# Patient Record
Sex: Female | Born: 1960
Health system: Southern US, Community
[De-identification: ages and names within clinical notes are randomized; demographics above are authoritative.]

## PROBLEM LIST (undated history)

## (undated) DIAGNOSIS — K219 Gastro-esophageal reflux disease without esophagitis: Secondary | ICD-10-CM

## (undated) DIAGNOSIS — T4145XA Adverse effect of unspecified anesthetic, initial encounter: Secondary | ICD-10-CM

## (undated) DIAGNOSIS — Z9889 Other specified postprocedural states: Secondary | ICD-10-CM

## (undated) DIAGNOSIS — R51 Headache: Secondary | ICD-10-CM

## (undated) DIAGNOSIS — R0989 Other specified symptoms and signs involving the circulatory and respiratory systems: Secondary | ICD-10-CM

## (undated) DIAGNOSIS — T8859XA Other complications of anesthesia, initial encounter: Secondary | ICD-10-CM

## (undated) DIAGNOSIS — J329 Chronic sinusitis, unspecified: Secondary | ICD-10-CM

## (undated) DIAGNOSIS — J342 Deviated nasal septum: Secondary | ICD-10-CM

## (undated) DIAGNOSIS — R112 Nausea with vomiting, unspecified: Secondary | ICD-10-CM

## (undated) DIAGNOSIS — J343 Hypertrophy of nasal turbinates: Secondary | ICD-10-CM

## (undated) HISTORY — DX: Morbid (severe) obesity due to excess calories: E66.01

---

## 1997-04-23 HISTORY — PX: DIAGNOSTIC LAPAROSCOPY: SUR761

## 1997-07-11 ENCOUNTER — Inpatient Hospital Stay (HOSPITAL_COMMUNITY): Admission: AD | Admit: 1997-07-11 | Discharge: 1997-07-14 | Payer: Self-pay | Admitting: Obstetrics and Gynecology

## 1998-09-16 ENCOUNTER — Ambulatory Visit (HOSPITAL_COMMUNITY): Admission: RE | Admit: 1998-09-16 | Discharge: 1998-09-16 | Payer: Self-pay | Admitting: Obstetrics and Gynecology

## 2001-10-09 ENCOUNTER — Other Ambulatory Visit: Admission: RE | Admit: 2001-10-09 | Discharge: 2001-10-09 | Payer: Self-pay | Admitting: Obstetrics and Gynecology

## 2002-04-23 DIAGNOSIS — R112 Nausea with vomiting, unspecified: Secondary | ICD-10-CM

## 2002-04-23 HISTORY — DX: Nausea with vomiting, unspecified: R11.2

## 2002-06-11 ENCOUNTER — Inpatient Hospital Stay (HOSPITAL_COMMUNITY): Admission: AD | Admit: 2002-06-11 | Discharge: 2002-06-14 | Payer: Self-pay | Admitting: Obstetrics and Gynecology

## 2002-06-11 ENCOUNTER — Encounter (INDEPENDENT_AMBULATORY_CARE_PROVIDER_SITE_OTHER): Payer: Self-pay | Admitting: *Deleted

## 2002-07-09 ENCOUNTER — Other Ambulatory Visit: Admission: RE | Admit: 2002-07-09 | Discharge: 2002-07-09 | Payer: Self-pay | Admitting: Obstetrics and Gynecology

## 2003-07-13 ENCOUNTER — Other Ambulatory Visit: Admission: RE | Admit: 2003-07-13 | Discharge: 2003-07-13 | Payer: Self-pay | Admitting: Obstetrics and Gynecology

## 2004-02-22 ENCOUNTER — Emergency Department (HOSPITAL_COMMUNITY): Admission: EM | Admit: 2004-02-22 | Discharge: 2004-02-22 | Payer: Self-pay | Admitting: Emergency Medicine

## 2005-07-16 ENCOUNTER — Encounter: Admission: RE | Admit: 2005-07-16 | Discharge: 2005-07-16 | Payer: Self-pay | Admitting: Obstetrics and Gynecology

## 2006-07-23 ENCOUNTER — Encounter: Admission: RE | Admit: 2006-07-23 | Discharge: 2006-07-23 | Payer: Self-pay | Admitting: Obstetrics and Gynecology

## 2007-07-29 ENCOUNTER — Encounter: Admission: RE | Admit: 2007-07-29 | Discharge: 2007-07-29 | Payer: Self-pay | Admitting: Obstetrics and Gynecology

## 2008-07-29 ENCOUNTER — Encounter: Admission: RE | Admit: 2008-07-29 | Discharge: 2008-07-29 | Payer: Self-pay | Admitting: Obstetrics and Gynecology

## 2009-08-02 ENCOUNTER — Encounter: Admission: RE | Admit: 2009-08-02 | Discharge: 2009-08-02 | Payer: Self-pay | Admitting: Obstetrics and Gynecology

## 2010-07-10 ENCOUNTER — Other Ambulatory Visit: Payer: Self-pay | Admitting: Obstetrics and Gynecology

## 2010-07-10 DIAGNOSIS — Z1231 Encounter for screening mammogram for malignant neoplasm of breast: Secondary | ICD-10-CM

## 2010-08-04 ENCOUNTER — Ambulatory Visit
Admission: RE | Admit: 2010-08-04 | Discharge: 2010-08-04 | Disposition: A | Discharge status: Home / Self Care | Payer: 59 | Source: Ambulatory Visit | Attending: Obstetrics and Gynecology | Admitting: Obstetrics and Gynecology

## 2010-08-04 DIAGNOSIS — Z1231 Encounter for screening mammogram for malignant neoplasm of breast: Secondary | ICD-10-CM

## 2010-08-31 ENCOUNTER — Other Ambulatory Visit: Payer: Self-pay | Admitting: Obstetrics and Gynecology

## 2010-09-08 NOTE — Discharge Summary (Signed)
   NAMEHUBERT, DERSTINE                              ACCOUNT NO.:  [id number]   MEDICAL RECORD NO.:  [id number]                   PATIENT TYPE:  INP   LOCATION:  9121                                 FACILITY:  [facility]   PHYSICIAN:  Lenoard Aden, M.D.             DATE OF BIRTH:  01-Jul-1960   DATE OF ADMISSION:  06/11/2002  DATE OF DISCHARGE:  06/14/2002                                 DISCHARGE SUMMARY   HISTORY OF PRESENT ILLNESS:  The patient underwent uncomplicated repeat  cesarean section on May 11, 2002.  Postoperative course uncomplicated.  Discharged to home day three.  Discharge teaching done.  Percocet and  prenatal vitamins given upon discharge.  Follow up in the office four weeks.                                               Lenoard Aden, M.D.    RJT/MEDQ  D:  07/07/2002  T:  07/08/2002  Job:  [id number]

## 2010-09-08 NOTE — Op Note (Signed)
Brandy Payne, Brandy Payne                              ACCOUNT NO.:  [id number]   MEDICAL RECORD NO.:  [id number]                   PATIENT TYPE:  INP   LOCATION:  9121                                 FACILITY:  [facility]   PHYSICIAN:  Lenoard Aden, M.D.             DATE OF BIRTH:  08/29/1960   DATE OF PROCEDURE:  06/11/2002  DATE OF DISCHARGE:                                 OPERATIVE REPORT   PREOPERATIVE DIAGNOSIS:  Thirty-nine week intrauterine pregnancy, previous  cesarean section, for repeat.   POSTOPERATIVE DIAGNOSIS:  Thirty-nine week intrauterine pregnancy, previous  cesarean section, for repeat.   PROCEDURE:  Repeat low segment transverse cesarean section.   SURGEON:  Lenoard Aden, M.D.   ASSISTANT:  Chester Holstein. Earlene Plater, M.D.   ANESTHESIA:  Spinal by Raul Del, M.D.   ESTIMATED BLOOD LOSS:  500 mL.   DRAINS:  Foley.   COUNTS:  Correct.   DISPOSITION:  The patient to recovery in good condition.   FINDINGS:  Full-term living female, 6 pounds 10 ounces, Apgars 8 and 9.  Placenta anterior with succenturiate lobe and intact, sent to pathology for  confirmation.  The patient to recovery in good condition.   DESCRIPTION OF PROCEDURE:  After being apprised of the risks of anesthesia,  infection, infection, bleeding, injury to abdominal organs, need for repair  the patient was brought to the operating room where she was administered  spinal anesthetic without complications, prepped and draped in the usual  sterile fashion, Foley catheter placed.  After achieving adequate anesthesia  and Foley catheter placed, dilute Marcaine solution placed in the area of  the old incision.  Pfannenstiel skin incision made with the scalpel, carried  down to the fascia with electrocautery, then opened using scissors.  Rectus  muscles with diastasis in the midline.  The peritoneum was entered sharply.  The bladder blade was placed.  The lower uterine segment window was noted  very thin  lower uterine segment.  This was then incised using a scalpel.  Clear amniotic fluid noted.  Atraumatic delivery of a 6 pound 10 ounce  female, handed to the pediatricians in attendance, cord clamped and cut.  Bulb suctioning performed, Apgars 8 and 9.  Placenta delivered manually,  intact.  Accessory lobe delivered spontaneously after placental mass was  delivered.  The uterus was curetted using a dry lap pack and then closed  using 0 Monocryl in two continuous layers.  Normal tubes and  ovaries noted.  Bladder flap inspected, found to be hemostatic.  Irrigation  accomplished.  After achieving adequate hemostatis the fascia was then  closed using 0 Monocryl in continuous running fashion, the skin closed using  staples.  The patient tolerated the procedure well and went to recovery in  good condition.  Lenoard Aden, M.D.    RJT/MEDQ  D:  06/11/2002  T:  06/11/2002  Job:  [id number]

## 2010-09-08 NOTE — H&P (Signed)
   Brandy Payne, Brandy Payne                              ACCOUNT NO.:  [id number]   MEDICAL RECORD NO.:  [id number]                   PATIENT TYPE:  INP   LOCATION:  NA                                   FACILITY:  [facility]   PHYSICIAN:  Lenoard Aden, M.D.             DATE OF BIRTH:  July 03, 1960   DATE OF ADMISSION:  06/11/2002  DATE OF DISCHARGE:                                HISTORY & PHYSICAL   CHIEF COMPLAINT:  Elective repeat C-section.   HISTORY OF PRESENT ILLNESS:  A 50 year old white female, G2, P1, EDD of  06/23/02 at 39 weeks who presents for elective repeat C-section.   OBSTETRICAL HISTORY:  Remarkable for uncomplicated primary C-section in  1999, breech presentation.   ALLERGIES:  Keflex, Sulfa, and doxycycline.   MEDICATIONS:  Prenatal vitamins.   PAST MEDICAL HISTORY:  Has a remote history of Chlamydia.  No other medical  or surgical hospitalizations.   FAMILY HISTORY:  Family history of myocardial infarction, hypertension, and  abdominal cancer.   PRENATAL LAB DATA:  Blood type: O positive, rubella immune, hepatitis B  surface antigen, HIV nonreactive.   PHYSICAL EXAMINATION:  GENERAL:  On physical exam she is a well-developed,  well-nourished, white female in no apparent distress.  HEENT:  Normal.  LUNGS:  Clear.  HEART:  Regular rate and rhythm.  ABDOMEN:  Soft, gravid, nontender.  Estimated fetal weight 8 pounds.  PELVIC:  Cervix is closed, 3 cm long.  Vertex out of the pelvis.  EXTREMITIES:  There are no cords.  NEUROLOGIC:  Nonfocal.   IMPRESSION:  A 39-week intrauterine pregnancy with desire for elective  repeat C-section.   PLAN:  Proceed with an elective repeat C-section. The risks of anesthesia,  infection, bleeding, injury to the abdominal organs or need for repair is  discussed.  The patient acknowledges and wishes to proceed.                                               Lenoard Aden, M.D.    RJT/MEDQ  D:  06/10/2002  T:  06/10/2002  Job:   [id number]

## 2011-07-31 ENCOUNTER — Other Ambulatory Visit: Payer: Self-pay | Admitting: Obstetrics and Gynecology

## 2011-07-31 DIAGNOSIS — Z1231 Encounter for screening mammogram for malignant neoplasm of breast: Secondary | ICD-10-CM

## 2011-08-07 ENCOUNTER — Ambulatory Visit
Admission: RE | Admit: 2011-08-07 | Discharge: 2011-08-07 | Disposition: A | Discharge status: Home / Self Care | Payer: 59 | Source: Ambulatory Visit | Attending: Obstetrics and Gynecology | Admitting: Obstetrics and Gynecology

## 2011-08-07 DIAGNOSIS — Z1231 Encounter for screening mammogram for malignant neoplasm of breast: Secondary | ICD-10-CM

## 2011-08-09 ENCOUNTER — Other Ambulatory Visit: Payer: Self-pay | Admitting: Obstetrics and Gynecology

## 2011-08-09 DIAGNOSIS — R928 Other abnormal and inconclusive findings on diagnostic imaging of breast: Secondary | ICD-10-CM

## 2011-08-13 ENCOUNTER — Ambulatory Visit
Admission: RE | Admit: 2011-08-13 | Discharge: 2011-08-13 | Disposition: A | Discharge status: Home / Self Care | Payer: 59 | Source: Ambulatory Visit | Attending: Obstetrics and Gynecology | Admitting: Obstetrics and Gynecology

## 2011-08-13 DIAGNOSIS — R928 Other abnormal and inconclusive findings on diagnostic imaging of breast: Secondary | ICD-10-CM

## 2011-12-05 ENCOUNTER — Other Ambulatory Visit (HOSPITAL_COMMUNITY): Payer: Self-pay | Admitting: Family Medicine

## 2011-12-05 DIAGNOSIS — J329 Chronic sinusitis, unspecified: Secondary | ICD-10-CM

## 2011-12-06 ENCOUNTER — Ambulatory Visit (HOSPITAL_COMMUNITY)
Admission: RE | Admit: 2011-12-06 | Discharge: 2011-12-06 | Disposition: A | Discharge status: Home / Self Care | Payer: 59 | Source: Ambulatory Visit | Attending: Family Medicine | Admitting: Family Medicine

## 2011-12-06 DIAGNOSIS — J329 Chronic sinusitis, unspecified: Secondary | ICD-10-CM | POA: Insufficient documentation

## 2011-12-23 DIAGNOSIS — J329 Chronic sinusitis, unspecified: Secondary | ICD-10-CM

## 2011-12-23 DIAGNOSIS — J343 Hypertrophy of nasal turbinates: Secondary | ICD-10-CM

## 2011-12-23 DIAGNOSIS — J342 Deviated nasal septum: Secondary | ICD-10-CM

## 2011-12-23 HISTORY — DX: Deviated nasal septum: J34.2

## 2011-12-23 HISTORY — DX: Chronic sinusitis, unspecified: J32.9

## 2011-12-23 HISTORY — DX: Hypertrophy of nasal turbinates: J34.3

## 2011-12-27 ENCOUNTER — Encounter (HOSPITAL_BASED_OUTPATIENT_CLINIC_OR_DEPARTMENT_OTHER): Payer: Self-pay | Admitting: *Deleted

## 2011-12-27 DIAGNOSIS — R0989 Other specified symptoms and signs involving the circulatory and respiratory systems: Secondary | ICD-10-CM

## 2011-12-27 HISTORY — DX: Other specified symptoms and signs involving the circulatory and respiratory systems: R09.89

## 2011-12-31 NOTE — H&P (Signed)
PREOPERATIVE H&P  Chief Complaint: sinus problems  HPI: Brandy Payne is a 51 y.o. female who presents for evaluation of a long history of sinus problems especially on the L side. She has history of allergies. She always has trouble breathing through the L side of her nose. She had a CT scan which showed L septal deviation and moderate sinus disease in both maxillary sinuses and anterior ethmoid sinuses. The frontal and sphenoid sinuses were clear. She's taken to the OR for septoplasty, TR and limited FESS.  Past Medical History  Diagnosis Date  . Complication of anesthesia     hard to wake up post-op  . PONV (postoperative nausea and vomiting)   . Headache     with menses  . Nasal septal deviation 12/2011  . Nasal turbinate hypertrophy 12/2011  . Chronic sinusitis 12/2011  . Runny nose 12/27/2011    clear drainage, associated with sinusitis  . GERD (gastroesophageal reflux disease)     TUMS or Pepcid as needed   Past Surgical History  Procedure Date  . Cesarean section 1999, 2004  . Diagnostic laparoscopy 1999    exc. cyst Fallopian tube   History   Social History  . Marital Status: Married    Spouse Name: N/A    Number of Children: N/A  . Years of Education: N/A   Social History Main Topics  . Smoking status: Never Smoker   . Smokeless tobacco: Never Used  . Alcohol Use: Yes     occasionally  . Drug Use: No  . Sexually Active:    Other Topics Concern  . None   Social History Narrative  . None   Family History  Problem Relation Age of Onset  . Anesthesia problems Mother     post-op N/V   Allergies  Allergen Reactions  . Cephalosporins Rash    HANDS  . Doxycycline Other (See Comments)    BURNING/PRICKLY SENSATION SOLES OF FEET   Prior to Admission medications   Medication Sig Start Date End Date Taking? Authorizing Provider  acetaminophen (TYLENOL) 325 MG tablet Take 650 mg by mouth every 6 (six) hours as needed.   Yes Historical Provider, MD  fish oil-omega-3  fatty acids 1000 MG capsule Take 2 g by mouth daily.   Yes Historical Provider, MD  Multiple Vitamin (MULTIVITAMIN) tablet Take 1 tablet by mouth daily.   Yes Historical Provider, MD  predniSONE (DELTASONE) 10 MG tablet Take 10 mg by mouth daily.   Yes Historical Provider, MD     Positive ROS: nasal obstruction and sinus infections.  All other systems have been reviewed and were otherwise negative with the exception of those mentioned in the HPI and as above.  Physical Exam: There were no vitals filed for this visit.  General: Alert, no acute distress Oral: Normal oral mucosa and tonsils Nasal: Septum deviated to L, large turbinates.. R nasal polyp Neck: No palpable adenopathy or thyroid nodules Ear: Ear canal is clear with normal appearing TMs Cardiovascular: Regular rate and rhythm, no murmur.  Respiratory: Clear to auscultation Neurologic: Alert and oriented x 3   Assessment/Plan: septal deviation  turbinate hypertrophy  chronic sinusitis  Plan for Procedure(s): NASAL SEPTOPLASTY WITH TURBINATE REDUCTION ENDOSCOPIC SINUS SURGERY   Dillard Cannon, MD 12/31/2011 3:05 PM

## 2012-01-01 ENCOUNTER — Encounter (HOSPITAL_BASED_OUTPATIENT_CLINIC_OR_DEPARTMENT_OTHER)
Admission: RE | Disposition: A | Discharge status: Home / Self Care | Payer: Self-pay | Source: Ambulatory Visit | Attending: Otolaryngology

## 2012-01-01 ENCOUNTER — Encounter (HOSPITAL_BASED_OUTPATIENT_CLINIC_OR_DEPARTMENT_OTHER): Payer: Self-pay

## 2012-01-01 ENCOUNTER — Ambulatory Visit (HOSPITAL_BASED_OUTPATIENT_CLINIC_OR_DEPARTMENT_OTHER)
Admission: RE | Admit: 2012-01-01 | Discharge: 2012-01-01 | Disposition: A | Discharge status: Home / Self Care | Payer: 59 | Source: Ambulatory Visit | Attending: Otolaryngology | Admitting: Otolaryngology

## 2012-01-01 ENCOUNTER — Encounter (HOSPITAL_BASED_OUTPATIENT_CLINIC_OR_DEPARTMENT_OTHER): Payer: Self-pay | Admitting: Anesthesiology

## 2012-01-01 ENCOUNTER — Ambulatory Visit (HOSPITAL_BASED_OUTPATIENT_CLINIC_OR_DEPARTMENT_OTHER): Payer: 59 | Admitting: Anesthesiology

## 2012-01-01 DIAGNOSIS — J342 Deviated nasal septum: Secondary | ICD-10-CM | POA: Insufficient documentation

## 2012-01-01 DIAGNOSIS — J320 Chronic maxillary sinusitis: Secondary | ICD-10-CM | POA: Insufficient documentation

## 2012-01-01 DIAGNOSIS — J343 Hypertrophy of nasal turbinates: Secondary | ICD-10-CM | POA: Insufficient documentation

## 2012-01-01 DIAGNOSIS — J322 Chronic ethmoidal sinusitis: Secondary | ICD-10-CM | POA: Insufficient documentation

## 2012-01-01 HISTORY — DX: Other specified symptoms and signs involving the circulatory and respiratory systems: R09.89

## 2012-01-01 HISTORY — DX: Other specified postprocedural states: Z98.890

## 2012-01-01 HISTORY — DX: Other complications of anesthesia, initial encounter: T88.59XA

## 2012-01-01 HISTORY — DX: Hypertrophy of nasal turbinates: J34.3

## 2012-01-01 HISTORY — PX: NASAL SEPTOPLASTY W/ TURBINOPLASTY: SHX2070

## 2012-01-01 HISTORY — DX: Chronic sinusitis, unspecified: J32.9

## 2012-01-01 HISTORY — DX: Gastro-esophageal reflux disease without esophagitis: K21.9

## 2012-01-01 HISTORY — DX: Nausea with vomiting, unspecified: R11.2

## 2012-01-01 HISTORY — DX: Headache: R51

## 2012-01-01 HISTORY — PX: NASAL SINUS SURGERY: SHX719

## 2012-01-01 HISTORY — DX: Adverse effect of unspecified anesthetic, initial encounter: T41.45XA

## 2012-01-01 HISTORY — DX: Deviated nasal septum: J34.2

## 2012-01-01 LAB — POCT HEMOGLOBIN-HEMACUE: Hemoglobin: 14.4 g/dL (ref 12.0–15.0)

## 2012-01-01 SURGERY — SEPTOPLASTY, NOSE, WITH NASAL TURBINATE REDUCTION
Anesthesia: General | Site: Nose | Wound class: Clean Contaminated

## 2012-01-01 MED ORDER — PROPOFOL 10 MG/ML IV BOLUS
INTRAVENOUS | Status: DC | PRN
  Administered 2012-01-01: 150 mg via INTRAVENOUS

## 2012-01-01 MED ORDER — FENTANYL CITRATE 0.05 MG/ML IJ SOLN
INTRAMUSCULAR | Status: DC | PRN
  Administered 2012-01-01 (×4): 25 ug via INTRAVENOUS
  Administered 2012-01-01 (×2): 50 ug via INTRAVENOUS

## 2012-01-01 MED ORDER — ONDANSETRON HCL 4 MG/2ML IJ SOLN
INTRAMUSCULAR | Status: DC | PRN
  Administered 2012-01-01: 4 mg via INTRAVENOUS

## 2012-01-01 MED ORDER — HYDROMORPHONE HCL PF 1 MG/ML IJ SOLN: 0.2500 mg | INTRAMUSCULAR | Status: DC | PRN

## 2012-01-01 MED ORDER — MEPERIDINE HCL 25 MG/ML IJ SOLN: 6.2500 mg | INTRAMUSCULAR | Status: DC | PRN

## 2012-01-01 MED ORDER — MIDAZOLAM HCL 5 MG/5ML IJ SOLN
INTRAMUSCULAR | Status: DC | PRN
  Administered 2012-01-01: 2 mg via INTRAVENOUS

## 2012-01-01 MED ORDER — OXYCODONE HCL 5 MG PO TABS
5.0000 mg | ORAL_TABLET | Freq: Once | ORAL | Status: DC | PRN
Start: 2012-01-01 — End: 2012-01-01

## 2012-01-01 MED ORDER — SUCCINYLCHOLINE CHLORIDE 20 MG/ML IJ SOLN
INTRAMUSCULAR | Status: DC | PRN
  Administered 2012-01-01: 100 mg via INTRAVENOUS

## 2012-01-01 MED ORDER — OXYCODONE HCL 5 MG/5ML PO SOLN: 5.0000 mg | Freq: Once | ORAL | Status: DC | PRN

## 2012-01-01 MED ORDER — DEXAMETHASONE SODIUM PHOSPHATE 4 MG/ML IJ SOLN
INTRAMUSCULAR | Status: DC | PRN
  Administered 2012-01-01: 10 mg via INTRAVENOUS

## 2012-01-01 MED ORDER — LIDOCAINE-EPINEPHRINE 1 %-1:100000 IJ SOLN
INTRAMUSCULAR | Status: DC | PRN
  Administered 2012-01-01: 14 mL

## 2012-01-01 MED ORDER — LIDOCAINE HCL (CARDIAC) 20 MG/ML IV SOLN
INTRAVENOUS | Status: DC | PRN
  Administered 2012-01-01 (×2): 50 mg via INTRAVENOUS

## 2012-01-01 MED ORDER — BACITRACIN ZINC 500 UNIT/GM EX OINT
TOPICAL_OINTMENT | CUTANEOUS | Status: DC | PRN
  Administered 2012-01-01: 1 via TOPICAL

## 2012-01-01 MED ORDER — HYDROCODONE-ACETAMINOPHEN 5-500 MG PO TABS: 1.0000 | ORAL_TABLET | Freq: Four times a day (QID) | ORAL | Status: AC | PRN

## 2012-01-01 MED ORDER — OXYMETAZOLINE HCL 0.05 % NA SOLN
NASAL | Status: DC | PRN
  Administered 2012-01-01: 1 via NASAL

## 2012-01-01 MED ORDER — AZITHROMYCIN 500 MG PO TABS: 500.0000 mg | ORAL_TABLET | Freq: Every day | ORAL | Status: AC

## 2012-01-01 MED ORDER — ONDANSETRON HCL 4 MG/2ML IJ SOLN: 4.0000 mg | Freq: Once | INTRAMUSCULAR | Status: DC | PRN

## 2012-01-01 MED ORDER — LACTATED RINGERS IV SOLN
INTRAVENOUS | Status: DC
  Administered 2012-01-01 (×2): via INTRAVENOUS

## 2012-01-01 MED ORDER — VANCOMYCIN HCL 1000 MG IV SOLR
1000.0000 mg | INTRAVENOUS | Status: DC | PRN
  Administered 2012-01-01: 1000 mg via INTRAVENOUS

## 2012-01-01 MED ORDER — METOCLOPRAMIDE HCL 5 MG/ML IJ SOLN
INTRAMUSCULAR | Status: DC | PRN
  Administered 2012-01-01: 10 mg via INTRAVENOUS

## 2012-01-01 SURGICAL SUPPLY — 53 items
ATTRACTOMAT 16X20 MAGNETIC DRP (DRAPES) IMPLANT
BLADE INF TURB ROT M4 2 5PK (BLADE) ×2 IMPLANT
BLADE RAD40 ROTATE 4M 4 5PK (BLADE) IMPLANT
BLADE RAD60 ROTATE M4 4 5PK (BLADE) IMPLANT
BLADE ROTATE RAD12 5PK M4 4MM (BLADE) IMPLANT
BLADE TRICUT ROTATE M4 4 5PK (BLADE) ×2 IMPLANT
BUR HS RAD FRONTAL 3 (BURR) IMPLANT
CANISTER SUC SOCK COL 7 IN (MISCELLANEOUS) ×2 IMPLANT
CANISTER SUCTION 1200CC (MISCELLANEOUS) ×2 IMPLANT
CATH SINUS GUIDE F-70 (CATHETERS) IMPLANT
CLOTH BEACON ORANGE TIMEOUT ST (SAFETY) ×2 IMPLANT
COAGULATOR SUCT 8FR VV (MISCELLANEOUS) IMPLANT
DECANTER SPIKE VIAL GLASS SM (MISCELLANEOUS) IMPLANT
DRESSING NASAL KENNEDY 3.5X.9 (MISCELLANEOUS) IMPLANT
DRSG NASAL KENNEDY 3.5X.9 (MISCELLANEOUS)
DRSG NASAL KENNEDY LMNT 8CM (GAUZE/BANDAGES/DRESSINGS) IMPLANT
DRSG NASOPORE 8CM (GAUZE/BANDAGES/DRESSINGS) ×2 IMPLANT
DRSG TELFA 3X8 NADH (GAUZE/BANDAGES/DRESSINGS) ×2 IMPLANT
ELECT REM PT RETURN 9FT ADLT (ELECTROSURGICAL)
ELECTRODE REM PT RTRN 9FT ADLT (ELECTROSURGICAL) IMPLANT
GLOVE SKINSENSE NS SZ7.0 (GLOVE) ×1
GLOVE SKINSENSE STRL SZ7.0 (GLOVE) ×1 IMPLANT
GLOVE SS BIOGEL STRL SZ 7.5 (GLOVE) ×1 IMPLANT
GLOVE SUPERSENSE BIOGEL SZ 7.5 (GLOVE) ×1
GOWN PREVENTION PLUS XLARGE (GOWN DISPOSABLE) ×2 IMPLANT
GOWN STRL REIN XL XLG (GOWN DISPOSABLE) ×2 IMPLANT
HANDPIECE HYDRODEBRIDER FRONT (BLADE) IMPLANT
HEMOSTAT SURGICEL .5X2 ABSORB (HEMOSTASIS) IMPLANT
HEMOSTAT SURGICEL 2X14 (HEMOSTASIS) IMPLANT
IV NS 500ML (IV SOLUTION) ×1
IV NS 500ML BAXH (IV SOLUTION) ×1 IMPLANT
NEEDLE 27GAX1X1/2 (NEEDLE) ×2 IMPLANT
NEEDLE SPNL 25GX3.5 QUINCKE BL (NEEDLE) IMPLANT
NS IRRIG 1000ML POUR BTL (IV SOLUTION) ×2 IMPLANT
PACK BASIN DAY SURGERY FS (CUSTOM PROCEDURE TRAY) ×2 IMPLANT
PACK ENT DAY SURGERY (CUSTOM PROCEDURE TRAY) ×2 IMPLANT
PATTIES SURGICAL .5 X3 (DISPOSABLE) ×2 IMPLANT
SHEET SILASTIC 8X6X.030 25-30 (MISCELLANEOUS) IMPLANT
SLEEVE SCD COMPRESS KNEE MED (MISCELLANEOUS) ×2 IMPLANT
SOLUTION ANTI FOG 6CC (MISCELLANEOUS) ×2 IMPLANT
SPONGE GAUZE 2X2 8PLY STRL LF (GAUZE/BANDAGES/DRESSINGS) ×2 IMPLANT
SUT CHROMIC 4 0 PS 2 18 (SUTURE) ×2 IMPLANT
SUT ETHILON 3 0 PS 1 (SUTURE) IMPLANT
SUT SILK 2 0 FS (SUTURE) ×2 IMPLANT
SUT VIC AB 4-0 P-3 18XBRD (SUTURE) IMPLANT
SUT VIC AB 4-0 P3 18 (SUTURE)
SYR 3ML 18GX1 1/2 (SYRINGE) IMPLANT
SYSTEM HYDRODEBRIDER (MISCELLANEOUS) IMPLANT
TOWEL OR 17X24 6PK STRL BLUE (TOWEL DISPOSABLE) ×2 IMPLANT
TRAY DSU PREP LF (CUSTOM PROCEDURE TRAY) ×2 IMPLANT
TUBE CONNECTING 20X1/4 (TUBING) ×2 IMPLANT
WATER STERILE IRR 1000ML POUR (IV SOLUTION) IMPLANT
YANKAUER SUCT BULB TIP NO VENT (SUCTIONS) ×2 IMPLANT

## 2012-01-01 NOTE — Anesthesia Postprocedure Evaluation (Signed)
Anesthesia Post Note  Patient: Brandy Payne  Procedure(s) Performed: Procedure(s) (LRB): NASAL SEPTOPLASTY WITH TURBINATE REDUCTION (N/A) ENDOSCOPIC SINUS SURGERY (N/A)  Anesthesia type: general  Patient location: PACU  Post pain: Pain level controlled  Post assessment: Patient's Cardiovascular Status Stable  Last Vitals:  Filed Vitals:   01/01/12 0633  BP: 136/85  Pulse: 86  Temp: 36.8 C  Resp: 20    Post vital signs: Reviewed and stable  Level of consciousness: sedated  Complications: No apparent anesthesia complications

## 2012-01-01 NOTE — Brief Op Note (Signed)
01/01/2012  9:45 AM  PATIENT:  Brandy Payne  51 y.o. female  PRE-OPERATIVE DIAGNOSIS:  septal deviation  turbinate hypertrophy  chronic sinusitis   POST-OPERATIVE DIAGNOSIS:  septal deviation  turbinate hypertrophy  chronic sinusitis  PROCEDURE:  Procedure(s) (LRB) with comments: NASAL SEPTOPLASTY WITH TURBINATE REDUCTION (N/A) - nasal  septoplasty with turbinate reductions and fess ENDOSCOPIC SINUS SURGERY (N/A)  SURGEON:  Surgeon(s) and Role:    * Drema Halon, MD - Primary  PHYSICIAN ASSISTANT:   ASSISTANTS: none   ANESTHESIA:   general  EBL:  Total I/O In: 1000 [I.V.:1000] Out: -   BLOOD ADMINISTERED:none  DRAINS: none   LOCAL MEDICATIONS USED:  Amount: 15 ml  SPECIMEN:  Source of Specimen:  sinus polyps and turbinate tissue  DISPOSITION OF SPECIMEN:  PATHOLOGY  COUNTS:  YES  TOURNIQUET:  * No tourniquets in log *  DICTATION: .Other Dictation: Dictation Number [id number]  PLAN OF CARE: Discharge to home after PACU  PATIENT DISPOSITION:  PACU - hemodynamically stable.   Delay start of Pharmacological VTE agent (>24hrs) due to surgical blood loss or risk of bleeding: yes

## 2012-01-01 NOTE — Interval H&P Note (Signed)
History and Physical Interval Note:  01/01/2012 7:42 AM  Brandy Payne  has presented today for surgery, with the diagnosis of septal deviation  turbinate hypertrophy  chronic sinusitis   The various methods of treatment have been discussed with the patient and family. After consideration of risks, benefits and other options for treatment, the patient has consented to  Procedure(s) (LRB) with comments: NASAL SEPTOPLASTY WITH TURBINATE REDUCTION (N/A) - nasal  septoplasty with turbinate reductions and fess ENDOSCOPIC SINUS SURGERY (N/A) as a surgical intervention .  The patient's history has been reviewed, patient examined, no change in status, stable for surgery.  I have reviewed the patient's chart and labs.  Questions were answered to the patient's satisfaction.     Cuma Polyakov

## 2012-01-01 NOTE — Anesthesia Preprocedure Evaluation (Signed)
Anesthesia Evaluation  Patient identified by MRN, date of birth, ID band Patient awake    Reviewed: Allergy & Precautions, H&P , NPO status , Patient's Chart, lab work & pertinent test results  History of Anesthesia Complications (+) PONV  Airway Mallampati: I TM Distance: >3 FB Neck ROM: Full    Dental   Pulmonary          Cardiovascular     Neuro/Psych    GI/Hepatic GERD-  Medicated and Controlled,  Endo/Other    Renal/GU      Musculoskeletal   Abdominal   Peds  Hematology   Anesthesia Other Findings   Reproductive/Obstetrics                           Anesthesia Physical Anesthesia Plan  ASA: II  Anesthesia Plan: General   Post-op Pain Management:    Induction: Intravenous  Airway Management Planned: Oral ETT  Additional Equipment:   Intra-op Plan:   Post-operative Plan: Extubation in OR  Informed Consent: I have reviewed the patients History and Physical, chart, labs and discussed the procedure including the risks, benefits and alternatives for the proposed anesthesia with the patient or authorized representative who has indicated his/her understanding and acceptance.     Plan Discussed with: CRNA and Surgeon  Anesthesia Plan Comments:         Anesthesia Quick Evaluation

## 2012-01-01 NOTE — Transfer of Care (Signed)
Immediate Anesthesia Transfer of Care Note  Patient: Brandy Payne  Procedure(s) Performed: Procedure(s) (LRB) with comments: NASAL SEPTOPLASTY WITH TURBINATE REDUCTION (N/A) - nasal  septoplasty with turbinate reductions and fess ENDOSCOPIC SINUS SURGERY (N/A)  Patient Location: PACU  Anesthesia Type: General  Level of Consciousness: awake, alert  and oriented  Airway & Oxygen Therapy: Patient Spontanous Breathing and Patient connected to face mask oxygen  Post-op Assessment: Report given to PACU RN and Post -op Vital signs reviewed and stable  Post vital signs: Reviewed and stable  Complications: No apparent anesthesia complications

## 2012-01-01 NOTE — Anesthesia Postprocedure Evaluation (Signed)
Anesthesia Post Note  Patient: Brandy Payne  Procedure(s) Performed: Procedure(s) (LRB): NASAL SEPTOPLASTY WITH TURBINATE REDUCTION (N/A) ENDOSCOPIC SINUS SURGERY (N/A)  Anesthesia type: general  Patient location: PACU  Post pain: Pain level controlled  Post assessment: Patient's Cardiovascular Status Stable  Last Vitals:  Filed Vitals:   01/01/12 1030  BP: 124/85  Pulse: 104  Temp:   Resp: 14    Post vital signs: Reviewed and stable  Level of consciousness: sedated  Complications: No apparent anesthesia complications

## 2012-01-01 NOTE — Anesthesia Procedure Notes (Signed)
Procedure Name: Intubation Date/Time: 01/01/2012 7:56 AM Performed by: Zenia Resides D Pre-anesthesia Checklist: Patient identified, Emergency Drugs available, Suction available, Patient being monitored and Timeout performed Patient Re-evaluated:Patient Re-evaluated prior to inductionOxygen Delivery Method: Circle System Utilized Preoxygenation: Pre-oxygenation with 100% oxygen Intubation Type: IV induction Ventilation: Mask ventilation without difficulty Laryngoscope Size: Mac and 3 Grade View: Grade II Tube type: Oral Number of attempts: 1 Airway Equipment and Method: stylet and oral airway Placement Confirmation: ETT inserted through vocal cords under direct vision,  positive ETCO2 and breath sounds checked- equal and bilateral Secured at: 22 cm Tube secured with: Tape Dental Injury: Teeth and Oropharynx as per pre-operative assessment

## 2012-01-02 ENCOUNTER — Encounter (HOSPITAL_BASED_OUTPATIENT_CLINIC_OR_DEPARTMENT_OTHER): Payer: Self-pay | Admitting: Otolaryngology

## 2012-01-02 NOTE — Op Note (Signed)
Brandy Payne, Brandy Payne                    ACCOUNT NO.:  [id number]  MEDICAL RECORD NO.:  [id number]  LOCATION:                                 FACILITY:  PHYSICIAN:  Kristine Garbe. Ezzard Standing, M.D.DATE OF BIRTH:  14-Feb-1961  DATE OF PROCEDURE:  01/01/2012 DATE OF DISCHARGE:                              OPERATIVE REPORT   PREOPERATIVE DIAGNOSES:  Septal deviation and turbinate hypertrophy with chronic sinus disease and nasal obstruction.  POSTOPERATIVE DIAGNOSES:  Septal deviation and turbinate hypertrophy with chronic sinus disease and nasal obstruction.  OPERATION PERFORMED:  Septoplasty with bilateral inferior turbinate reductions.  Functional endoscopic sinus surgery with bilateral ethmoidectomy and bilateral maxillary ostial enlargement.  SURGEON:  Kristine Garbe. Ezzard Standing, MD  ANESTHESIA:  General.  COMPLICATIONS:  None.  BRIEF CLINICAL INDICATION:  Brandy Payne is a 51 year old female who has history of allergies as well as a history of sinus problems especially on the left side.  She has been on three rounds of antibiotics this year for sinus infections.  She also has history of allergies, for which, she takes medication.  She underwent a CT scan, which showed a significant septal deviation with a large bony spur protruding to the left middle turbinate and left middle meatus area.  She had some small polyps within the middle meatus and anterior ethmoid area and some mucoperiosteal thickening within both maxillary sinuses.  The sphenoid sinuses and frontal sinuses were clear.  She was taken to the operating room at this time for septoplasty, turbinate reductions, and functional endoscopic sinus surgery.  DESCRIPTION OF PROCEDURE:  After adequate endotracheal anesthesia, the nose was prepped with Betadine solution and draped out in a sterile towels.  The nose was then further prepped with cotton pledgets, soaked in Afrin for decongesting the nose.  Septum turbinates and middle  meatus area was injected with approximately 15 mL of Xylocaine with epinephrine for hemostasis.  On examination, the patient had a fairly significant septal deviation to the left making evaluation of the left middle meatus and injection of the left middle meatus little bit difficult.  Until the septoplasty was performed, a hemitransfixion incision was made along the cartilaginous septum on the right side.  Mucoperiosteal flaps were elevated posteriorly.  Just anterior to the bony septum, a vertical incision was made through the cartilage.  The mucoperiosteal and mucoperichondrial flaps were elevated posterior to this on either side of the septum.  The patient had a large bony spur protruding to the left middle meatus area.  After elevating mucoperiosteum on either side of the spur, the large spur was removed in addition to some of the bony septum that protruded more to the left side.  This allowed better access to the middle meatus on the left side as well as improve airway on the left side.  The anterior cartilaginous septum was fairly midline.  This completed the septoplasty portion of the procedure.  Next, using the endoscopes, middle meatus was approached on the right side, and she had several polyps within the middle meatus that were removed and then the uncinate process was incised and removed.  The anterior and few of the posterior ethmoid cells  were opened up with a straight Thru-Cut forceps and the microdebrider.  The sinus ostia was identified and then it was enlarged with a backbiting and straight Thru-Cut forceps to approximately 1 cm in size.  After completing the ethmoidectomy and the ostial enlargement, right side was completed.  Next, the left side was approached.  The middle meatus with outfractured.  The uncinate process was incised and then the anterior and few of the posterior ethmoid cells were opened up with straight Thru-Cut and microdebrider.  The few little small  polyps on the left side, which were removed.  The maxillary ostia on the left side was identified, was enlarged to with backbiting and straight Thru-Cut forceps.  The maxillary sinus was fairly clear of any disease.  This completed the procedure on the sinuses bilaterally.  At the very completion of the procedure, inferior turbinate reductions were performed using the turbinate blade on the microdebrider.  Submucosal turbinate reductions were performed bilaterally and the turbinates were outfractured.  The patient had some cobblestone almost bumpy inferior turbinate tissue and a small piece of turbinate tissue from the left side was excised and sent to Pathology for evaluation.  This area was cauterized with suction cautery.  This completed the procedure.  The hemitransfixion incision was closed with interrupted 4-0 chromic suture. The nose was packed with Nasopore, placed within the middle meatus bilaterally and then Telfa soaked in bacitracin ointment was placed along the septum on either side bilaterally and secured anteriorly with 2-0 silk suture.  This completed the procedure.  Brandy Payne was awakened from anesthesia and transferred to the recovery room, postop doing well.  DISPOSITION:  She is discharged home later this morning on Z-PAK for 5 days, Tylenol, Vicodin p.r.n. pain.  We will have her follow up in my office tomorrow to have the Telfa pack removed from the nose.  We will have her follow up in 1 week for recheck and cleaning the sinuses.          ______________________________ Kristine Garbe Ezzard Standing, M.D.     CEN/MEDQ  D:  01/01/2012  T:  01/02/2012  Job:  [id number]  cc:   Lucky Cowboy, M.D.

## 2012-07-30 ENCOUNTER — Other Ambulatory Visit: Payer: Self-pay

## 2012-07-30 DIAGNOSIS — Z1231 Encounter for screening mammogram for malignant neoplasm of breast: Secondary | ICD-10-CM

## 2012-08-26 ENCOUNTER — Emergency Department (HOSPITAL_COMMUNITY): Payer: 59

## 2012-08-26 ENCOUNTER — Emergency Department (HOSPITAL_COMMUNITY)
Admission: EM | Admit: 2012-08-26 | Discharge: 2012-08-26 | Disposition: A | Discharge status: Home / Self Care | Payer: 59 | Attending: Emergency Medicine | Admitting: Emergency Medicine

## 2012-08-26 ENCOUNTER — Encounter (HOSPITAL_COMMUNITY): Payer: Self-pay | Admitting: Emergency Medicine

## 2012-08-26 DIAGNOSIS — W010XXA Fall on same level from slipping, tripping and stumbling without subsequent striking against object, initial encounter: Secondary | ICD-10-CM | POA: Insufficient documentation

## 2012-08-26 DIAGNOSIS — W19XXXA Unspecified fall, initial encounter: Secondary | ICD-10-CM

## 2012-08-26 DIAGNOSIS — X500XXA Overexertion from strenuous movement or load, initial encounter: Secondary | ICD-10-CM | POA: Insufficient documentation

## 2012-08-26 DIAGNOSIS — Y9289 Other specified places as the place of occurrence of the external cause: Secondary | ICD-10-CM | POA: Insufficient documentation

## 2012-08-26 DIAGNOSIS — Y9301 Activity, walking, marching and hiking: Secondary | ICD-10-CM | POA: Insufficient documentation

## 2012-08-26 DIAGNOSIS — S0101XA Laceration without foreign body of scalp, initial encounter: Secondary | ICD-10-CM

## 2012-08-26 DIAGNOSIS — Z87720 Personal history of (corrected) congenital malformations of eye: Secondary | ICD-10-CM | POA: Insufficient documentation

## 2012-08-26 DIAGNOSIS — S93409A Sprain of unspecified ligament of unspecified ankle, initial encounter: Secondary | ICD-10-CM | POA: Insufficient documentation

## 2012-08-26 DIAGNOSIS — Z8709 Personal history of other diseases of the respiratory system: Secondary | ICD-10-CM | POA: Insufficient documentation

## 2012-08-26 DIAGNOSIS — Z8719 Personal history of other diseases of the digestive system: Secondary | ICD-10-CM | POA: Insufficient documentation

## 2012-08-26 DIAGNOSIS — Z79899 Other long term (current) drug therapy: Secondary | ICD-10-CM | POA: Insufficient documentation

## 2012-08-26 DIAGNOSIS — Z8669 Personal history of other diseases of the nervous system and sense organs: Secondary | ICD-10-CM | POA: Insufficient documentation

## 2012-08-26 MED ORDER — TRAMADOL HCL 50 MG PO TABS: 50.0000 mg | ORAL_TABLET | Freq: Four times a day (QID) | ORAL | Status: DC | PRN

## 2012-08-26 NOTE — ED Notes (Signed)
Pt c/o tripping today and falling into rocking chair and porch; pt with laceration to back of head and pain in left ankle; pt denies LOC; bleeding controlled

## 2012-08-26 NOTE — ED Provider Notes (Signed)
History     CSN: [id number]  Arrival date & time 08/26/12  0917   First MD Initiated Contact with Patient 08/26/12 [phone]      Chief Complaint  Patient presents with  . Fall  . Head Laceration  . Foot Pain    (Consider location/radiation/quality/duration/timing/severity/associated sxs/prior treatment) HPI Comments: Patient reports that just prior to arrival she tripped on a step walking out of her door.  She then fell into a chair that was on her front porch and cut her left lateral scalp on the chair.  She also twisted her left ankle when she fell and is having pain of the left ankle.  She denies LOC.  She has been able to ambulate since the injury, but has increased pain with ambulation.  She has not taken anything for pain prior to arrival.  She is currently not on any blood thinning medications.  She denies any nausea, vomiting, or vision changes.   Denies any neck or back pain.  No numbness or tingling.  The history is provided by the patient.    Past Medical History  Diagnosis Date  . Complication of anesthesia     hard to wake up post-op  . PONV (postoperative nausea and vomiting)   . Headache     with menses  . Nasal septal deviation 12/2011  . Nasal turbinate hypertrophy 12/2011  . Chronic sinusitis 12/2011  . Runny nose 12/27/2011    clear drainage, associated with sinusitis  . GERD (gastroesophageal reflux disease)     TUMS or Pepcid as needed    Past Surgical History  Procedure Laterality Date  . Cesarean section  1999, 2004  . Diagnostic laparoscopy  1999    exc. cyst Fallopian tube  . Nasal septoplasty w/ turbinoplasty  01/01/2012    Procedure: NASAL SEPTOPLASTY WITH TURBINATE REDUCTION;  Surgeon: Drema Halon, MD;  Location: [facility];  Service: ENT;  Laterality: N/A;  nasal  septoplasty with turbinate reductions and fess  . Nasal sinus surgery  01/01/2012    Procedure: ENDOSCOPIC SINUS SURGERY;  Surgeon: Drema Halon, MD;  Location:   SURGERY CENTER;  Service: ENT;  Laterality: N/A;    Family History  Problem Relation Age of Onset  . Anesthesia problems Mother     post-op N/V    History  Substance Use Topics  . Smoking status: Never Smoker   . Smokeless tobacco: Never Used  . Alcohol Use: Yes     Comment: occasionally    OB History   Grav Para Term Preterm Abortions TAB SAB Ect Mult Living                  Review of Systems  Musculoskeletal:       Ankle pain  Skin: Positive for wound.  All other systems reviewed and are negative.    Allergies  Cephalosporins; Doxycycline; and Sulfa antibiotics  Home Medications   Current Outpatient Rx  Name  Route  Sig  Dispense  Refill  . cetirizine (ZYRTEC) 10 MG tablet   Oral   Take 10 mg by mouth daily.         . Cholecalciferol (VITAMIN D) 2000 UNITS CAPS   Oral   Take 2,000 Units by mouth daily.          . fish oil-omega-3 fatty acids 1000 MG capsule   Oral   Take 2 g by mouth daily.  BP 136/90  Pulse 97  Temp(Src) 98 F (36.7 C) (Oral)  Resp 18  Ht 4\' 11"  (1.499 m)  Wt 180 lb (81.647 kg)  BMI 36.34 kg/m2  SpO2 100%  LMP 07/23/2012  Physical Exam  Nursing note and vitals reviewed. Constitutional: She appears well-developed and well-nourished. No distress.  HENT:  Head:    Eyes: EOM are normal. Pupils are equal, round, and reactive to light.  Neck: Normal range of motion. Neck supple.  Cardiovascular: Normal rate, regular rhythm and normal heart sounds.   Pulmonary/Chest: Effort normal and breath sounds normal.  Musculoskeletal:       Left hip: She exhibits normal range of motion, normal strength and no swelling.       Left knee: She exhibits normal range of motion, no swelling and no deformity. No tenderness found.       Left ankle: She exhibits swelling. She exhibits no deformity and no laceration. Tenderness. Lateral malleolus tenderness found.       Cervical back: She exhibits normal range of motion,  no tenderness, no bony tenderness, no swelling, no edema and no deformity.       Thoracic back: She exhibits normal range of motion, no tenderness, no bony tenderness, no swelling, no edema and no deformity.       Lumbar back: She exhibits normal range of motion, no tenderness, no bony tenderness, no swelling, no edema and no deformity.  Pelvis stable  Neurological: She is alert. She has normal strength. No cranial nerve deficit or sensory deficit. Gait normal.  Skin: She is not diaphoretic.    ED Course  Procedures (including critical care time)  Labs Reviewed - No data to display Dg Ankle Complete Left  08/26/2012  *RADIOLOGY REPORT*  Clinical Data: Pain post trauma  LEFT ANKLE COMPLETE - 3+ VIEW  Comparison:  February 22, 2004  Findings:  Frontal, oblique, lateral views were obtained. Calcification is noted adjacent to the lateral malleolus, indicative of old trauma.  There is no acute appearing fracture. No effusion.  Ankle mortise appears intact.  There is a minimal spur arising from the posterior calcaneus.  IMPRESSION: Residua of old trauma lateral malleolar region.  No acute fracture or joint effusion.  Mortise intact.   Original Report Authenticated By: Bretta Bang, M.D.      No diagnosis found.  LACERATION REPAIR Performed by: Anne Shutter, Dysen Edmondson Authorized by: Anne Shutter, Herbert Seta Consent: Verbal consent obtained. Risks and benefits: risks, benefits and alternatives were discussed Consent given by: patient Patient identity confirmed: provided demographic data Prepped and Draped in normal sterile fashion Wound explored  Laceration Location: left scalp  Laceration Length: 5 cm  No Foreign Bodies seen or palpated  Anesthesia: local infiltration  Local anesthetic: lidocaine 2% with epinephrine  Anesthetic total: 4 ml  Irrigation method: syringe Amount of cleaning: standard  Skin closure: staples  Number of sutures: 6  Patient tolerance: Patient tolerated the  procedure well with no immediate complications.   MDM  Patient presenting after a mechanical fall with laceration of the scalp and left ankle pain.  No LOC.  Normal neurological exam.  She is currently not on any blood thinning medications.  Laceration repaired with staples.  Xray of the ankle negative.  Patient neurovascularly intact.  Patient given ankle ASO and crutches and discharged home.  Return precautions given.        Pascal Lux [place], PA-C 08/27/12 [phone]

## 2012-08-26 NOTE — Discharge Instructions (Signed)
Ankle Sprain  An ankle sprain happens when the bands of tissue that hold the ankle bones together (ligaments) stretch too much and tear.   HOME CARE    Put ice on the ankle for 15 to 20 minutes, 3 to 4 times a day.   Put ice in a plastic bag.   Place a towel between your skin and the bag.   You may stop icing when the puffiness (swelling) goes down.   Raise (elevate) the injured ankle to lessen puffiness.   Use crutches if your doctor tells you to. Use them until you can walk without pain.   If a plaster splint was applied:   Rest the plaster splint on nothing harder than a pillow for 24 hours.   Do not put weight on it.   Do not get it wet.   Take it off to shower or bathe.   Follow up with your doctor.   Use an elastic wrap for support. Take the wrap off if the toes lose feeling (numb), tingle, or turn cold or blue.   If an air splint was applied:   Add or release air to make it comfortable.   Take it off at night and to shower and bathe.   Wiggle your toes while wearing the air splint.   Only take medicine as told by your doctor.   Do not drive until your doctor says it is okay.  GET HELP RIGHT AWAY IF:    You have more bruising, puffiness, or pain.   Your toes feel cold.   Your medicine does not help lessen your pain.   You are losing feeling in your toes or they turn blue.   You have severe pain.  MAKE SURE YOU:    Understand these instructions.   Will watch your condition.   Will get help right away if you are not doing well or get worse.  Document Released: 09/26/2007 Document Revised: 07/02/2011 Document Reviewed: 09/26/2007  Exit[employer] Patient Information 2013 [employer].

## 2012-08-28 NOTE — ED Provider Notes (Signed)
Medical screening examination/treatment/procedure(s) were performed by non-physician practitioner and as supervising physician I was immediately available for consultation/collaboration.   Kyzen Horn, MD 08/28/12 1528 

## 2012-09-09 ENCOUNTER — Ambulatory Visit
Admission: RE | Admit: 2012-09-09 | Discharge: 2012-09-09 | Disposition: A | Discharge status: Home / Self Care | Payer: 59 | Source: Ambulatory Visit

## 2012-09-09 DIAGNOSIS — Z1231 Encounter for screening mammogram for malignant neoplasm of breast: Secondary | ICD-10-CM

## 2012-09-16 ENCOUNTER — Other Ambulatory Visit: Payer: Self-pay | Admitting: Gastroenterology

## 2012-10-28 ENCOUNTER — Encounter (HOSPITAL_COMMUNITY): Payer: Self-pay | Admitting: Pharmacy Technician

## 2012-10-29 ENCOUNTER — Encounter (HOSPITAL_COMMUNITY): Payer: Self-pay | Admitting: *Deleted

## 2012-11-18 ENCOUNTER — Ambulatory Visit (HOSPITAL_COMMUNITY): Payer: 59 | Admitting: Anesthesiology

## 2012-11-18 ENCOUNTER — Encounter (HOSPITAL_COMMUNITY): Payer: Self-pay | Admitting: *Deleted

## 2012-11-18 ENCOUNTER — Encounter (HOSPITAL_COMMUNITY): Payer: Self-pay | Admitting: Anesthesiology

## 2012-11-18 ENCOUNTER — Encounter (HOSPITAL_COMMUNITY)
Admission: RE | Disposition: A | Discharge status: Home / Self Care | Payer: Self-pay | Source: Ambulatory Visit | Attending: Gastroenterology

## 2012-11-18 ENCOUNTER — Ambulatory Visit (HOSPITAL_COMMUNITY)
Admission: RE | Admit: 2012-11-18 | Discharge: 2012-11-18 | Disposition: A | Discharge status: Home / Self Care | Payer: 59 | Source: Ambulatory Visit | Attending: Gastroenterology | Admitting: Gastroenterology

## 2012-11-18 DIAGNOSIS — Z1211 Encounter for screening for malignant neoplasm of colon: Secondary | ICD-10-CM | POA: Insufficient documentation

## 2012-11-18 HISTORY — PX: COLONOSCOPY WITH PROPOFOL: SHX5780

## 2012-11-18 SURGERY — COLONOSCOPY WITH PROPOFOL
Anesthesia: Monitor Anesthesia Care

## 2012-11-18 MED ORDER — LACTATED RINGERS IV SOLN
INTRAVENOUS | Status: DC
  Administered 2012-11-18: 100 mL via INTRAVENOUS

## 2012-11-18 MED ORDER — PROPOFOL INFUSION 10 MG/ML OPTIME
INTRAVENOUS | Status: DC | PRN
  Administered 2012-11-18: 300 ug/kg/min via INTRAVENOUS

## 2012-11-18 MED ORDER — SODIUM CHLORIDE 0.9 % IV SOLN: INTRAVENOUS | Status: DC

## 2012-11-18 SURGICAL SUPPLY — 21 items

## 2012-11-18 NOTE — Transfer of Care (Signed)
Immediate Anesthesia Transfer of Care Note  Patient: Brandy Payne  Procedure(s) Performed: Procedure(s): COLONOSCOPY WITH PROPOFOL (N/A)  Patient Location: PACU and Endoscopy Unit  Anesthesia Type:MAC  Level of Consciousness: awake, alert  and patient cooperative  Airway & Oxygen Therapy: Patient Spontanous Breathing and Patient connected to face mask oxygen  Post-op Assessment: Report given to PACU RN and Post -op Vital signs reviewed and stable  Post vital signs: Reviewed and stable  Complications: No apparent anesthesia complications

## 2012-11-18 NOTE — H&P (Signed)
  Procedure: Baseline screening colonoscopy  History: The patient is a 52 year old female born [birth year]. Patient is scheduled to undergo her first screening colonoscopy with polypectomy to prevent colon cancer.  Medication allergies: None  Past medical history: Chronic sinusitis. Uterine cyst. Cesarean sections in 1999 and 2004. Fiberoptic sinus surgery.  Habits: The patient has never smoked cigarettes and consumes alcohol in moderation  Exam: The patient is alert and lying comfortably on the endoscopy stretcher. Cardiac exam reveals a regular rhythm. Lungs are clear to auscultation. Abdomen is soft and nontender to palpation.  Plan: Proceed with baseline screening colonoscopy.

## 2012-11-18 NOTE — Preoperative (Signed)
Beta Blockers   Reason not to administer Beta Blockers:Not Applicable 

## 2012-11-18 NOTE — Anesthesia Preprocedure Evaluation (Signed)
Anesthesia Evaluation  Patient identified by MRN, date of birth, ID band Patient awake    Reviewed: Allergy & Precautions, H&P , NPO status , Patient's Chart, lab work & pertinent test results  History of Anesthesia Complications (+) PONV  Airway Mallampati: I TM Distance: >3 FB Neck ROM: Full    Dental   Pulmonary neg pulmonary ROS,          Cardiovascular negative cardio ROS      Neuro/Psych negative neurological ROS  negative psych ROS   GI/Hepatic Neg liver ROS, GERD-  Medicated and Controlled,  Endo/Other  negative endocrine ROS  Renal/GU negative Renal ROS  negative genitourinary   Musculoskeletal negative musculoskeletal ROS (+)   Abdominal   Peds negative pediatric ROS (+)  Hematology negative hematology ROS (+)   Anesthesia Other Findings   Reproductive/Obstetrics negative OB ROS                           Anesthesia Physical Anesthesia Plan  ASA: II  Anesthesia Plan: MAC   Post-op Pain Management:    Induction:   Airway Management Planned: Simple Face Mask  Additional Equipment:   Intra-op Plan:   Post-operative Plan:   Informed Consent: I have reviewed the patients History and Physical, chart, labs and discussed the procedure including the risks, benefits and alternatives for the proposed anesthesia with the patient or authorized representative who has indicated his/her understanding and acceptance.   Dental advisory given  Plan Discussed with:   Anesthesia Plan Comments:         Anesthesia Quick Evaluation

## 2012-11-18 NOTE — Anesthesia Postprocedure Evaluation (Signed)
  Anesthesia Post-op Note  Patient: Brandy Payne  Procedure(s) Performed: Procedure(s) (LRB): COLONOSCOPY WITH PROPOFOL (N/A)  Patient Location: PACU  Anesthesia Type: MAC  Level of Consciousness: awake and alert   Airway and Oxygen Therapy: Patient Spontanous Breathing  Post-op Pain: mild  Post-op Assessment: Post-op Vital signs reviewed, Patient's Cardiovascular Status Stable, Respiratory Function Stable, Patent Airway and No signs of Nausea or vomiting  Last Vitals:  Filed Vitals:   11/18/12 1307  BP: 135/96  Pulse:   Temp:   Resp: 18    Post-op Vital Signs: stable   Complications: No apparent anesthesia complications

## 2012-11-18 NOTE — Op Note (Signed)
Procedure: Baseline screening colonoscopy  Endoscopist: Danise Edge  Premedication: Propofol administered by anesthesia  Procedure: The patient was placed in the left lateral decubitus position. Anal inspection and digital rectal exam were normal. The Pentax pediatric colonoscope was introduced into the rectum and advanced to the cecum. A normal-appearing ileocecal valve and appendiceal orifice were identified. Colonic preparation for the exam today was good.  Rectum. Normal. Retroflexed view of the distal rectum normal.  Sigmoid colon and descending colon. Normal.  Splenic flexure. Normal.  Transverse colon. Normal.  Hepatic flexure. Normal.  Ascending colon. Normal.  Cecum and ileocecal valve. Normal.  Assessment: Normal screening proctocolonoscopy to the cecum  Recommendations: Schedule repeat screening colonoscopy in 10 years.

## 2012-11-19 ENCOUNTER — Encounter (HOSPITAL_COMMUNITY): Payer: Self-pay | Admitting: Gastroenterology

## 2013-09-16 IMAGING — CT CT MAXILLOFACIAL W/O CM
2 series · 16 of 37 positions shown, 20 images · non-contrast
Comparison: None.

CLINICAL DATA: Recurrent sinusitis.

CT MAXILLOFACIAL WITHOUT CONTRAST
TECHNIQUE: Multidetector CT imaging of the maxillofacial
structures was performed. Multiplanar CT image reconstructions were
also generated.

[Series 602: coronal images · axial · 0.29mm/px · z∈[-534,-451]mm · 13 of 55 slices shown, 17 images]
[im 4/55  brain]
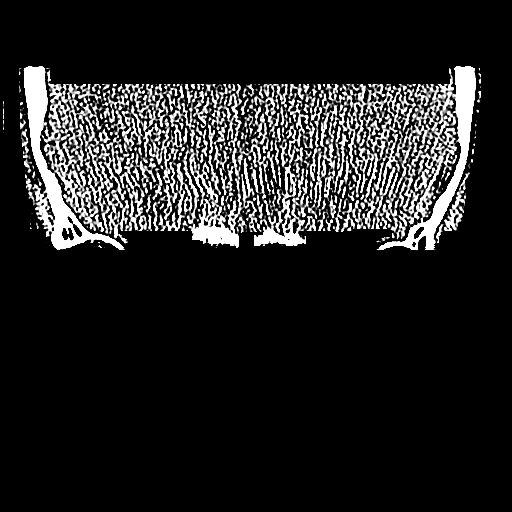
[im 4/55  bone]
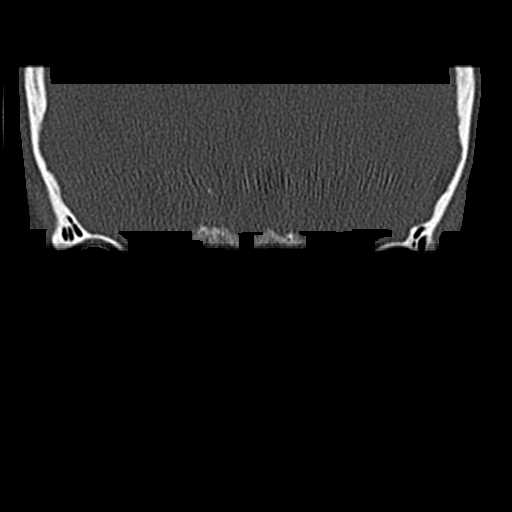
[im 8/55  bone]
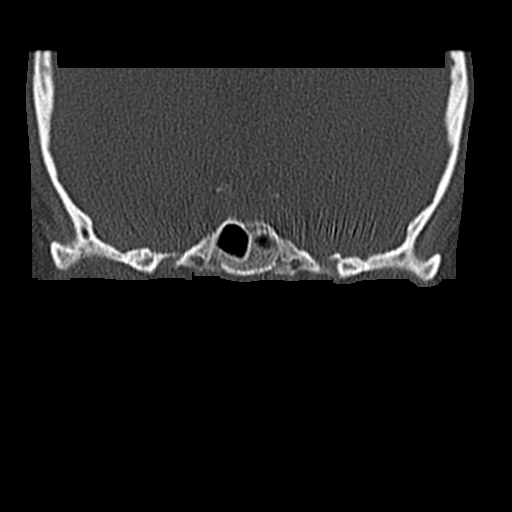
[im 12/55  bone]
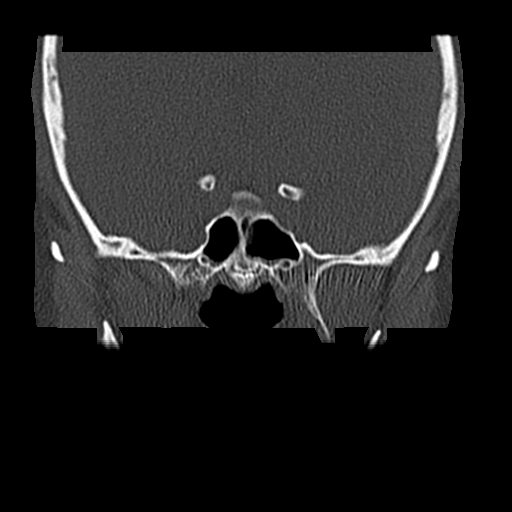
[im 15/55  bone]
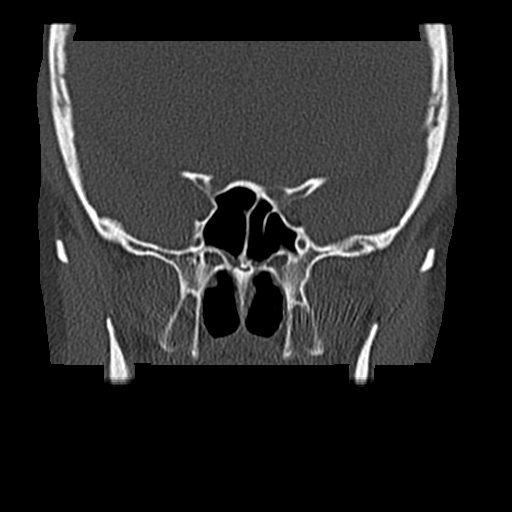
[im 19/55  brain]
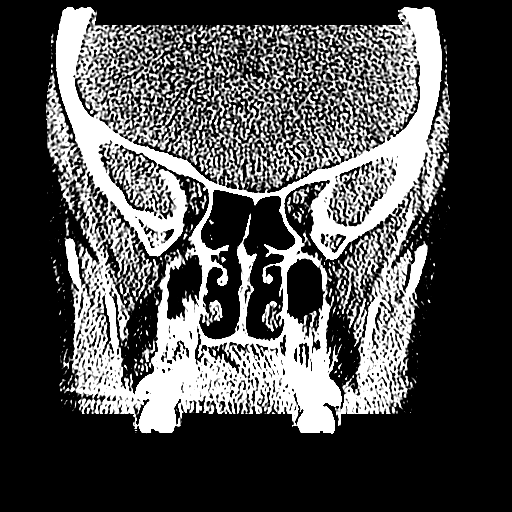
[im 19/55  bone]
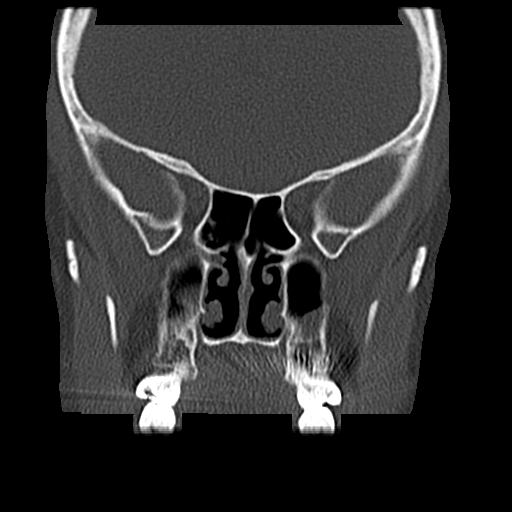
[im 23/55  bone]
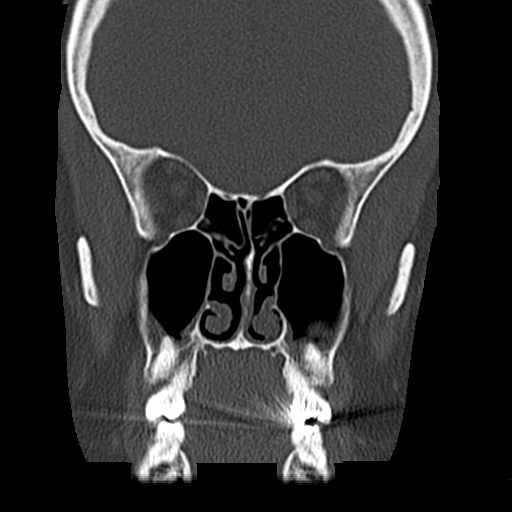
[im 28/55  bone]
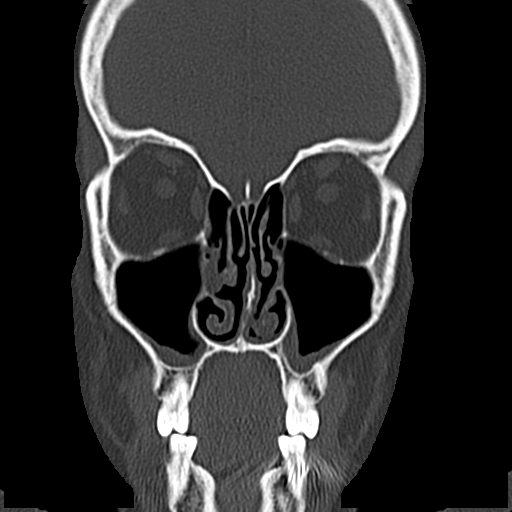
[im 32/55  bone]
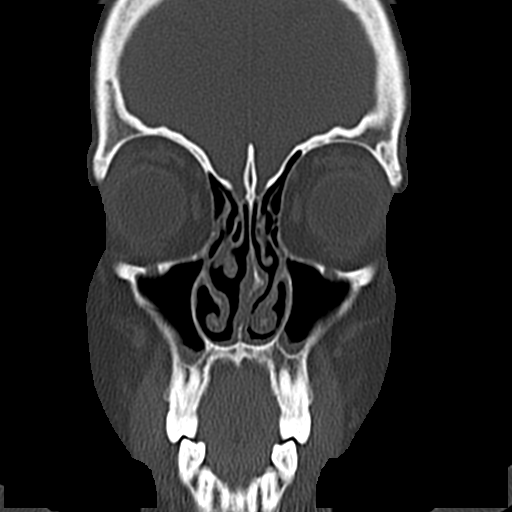
[im 36/55  brain]
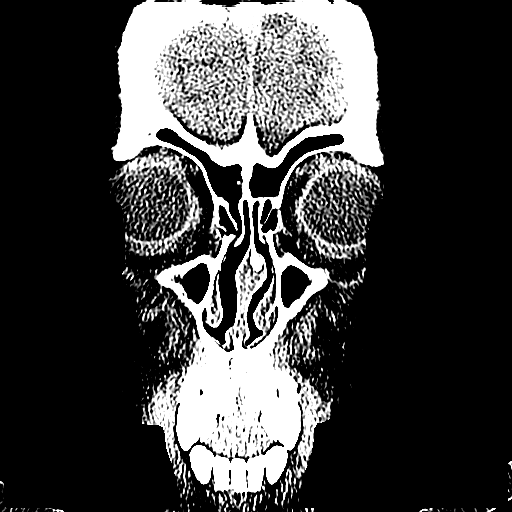
[im 36/55  bone]
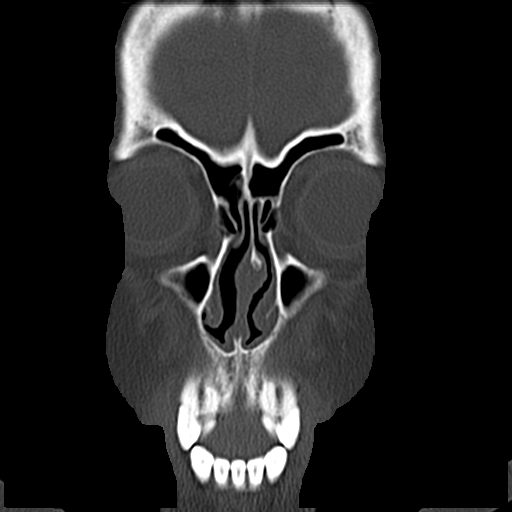
[im 40/55  bone]
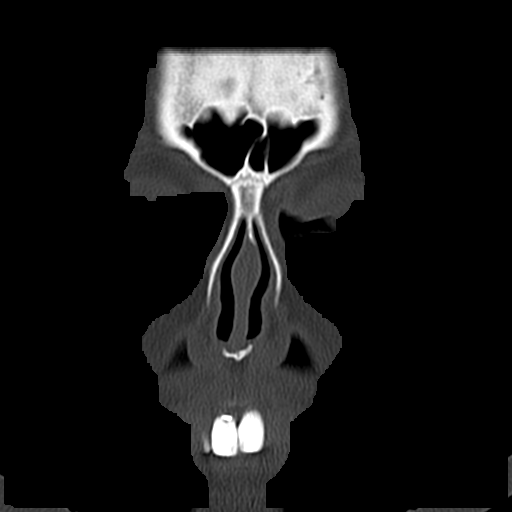
[im 43/55  bone]
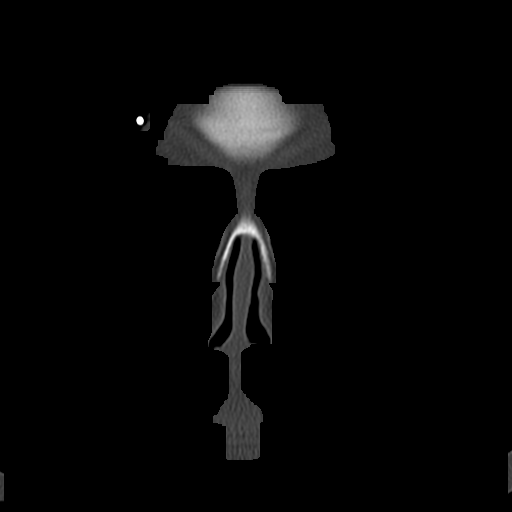
[im 47/55  bone]
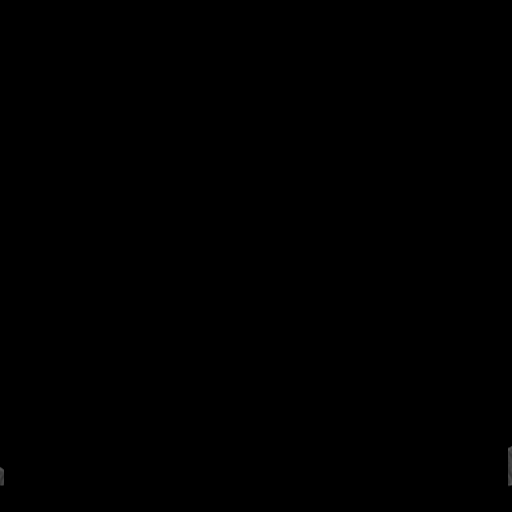
[im 51/55  brain]
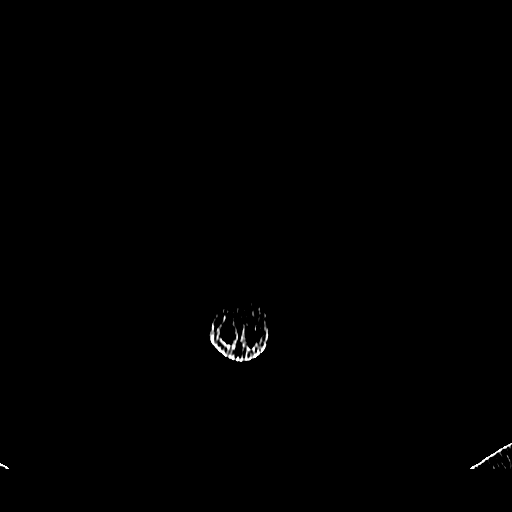
[im 51/55  bone]
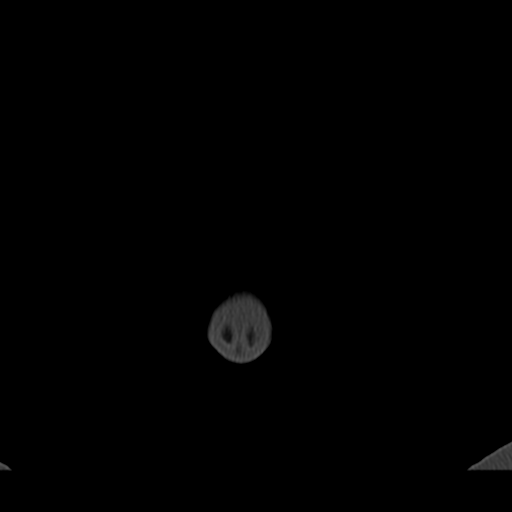

[Series 603: sagittal images · sagittal · 0.29mm/px · 3 of 67 slices shown]
[im 23/67  bone]
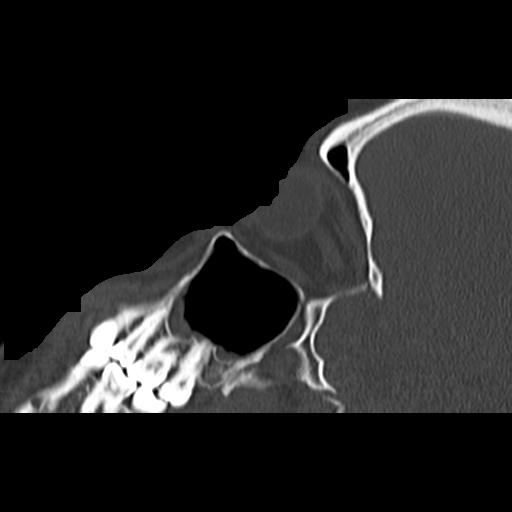
[im 34/67  bone]
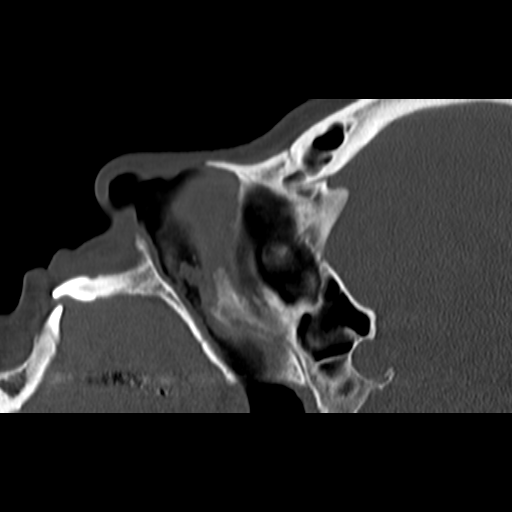
[im 45/67  bone]
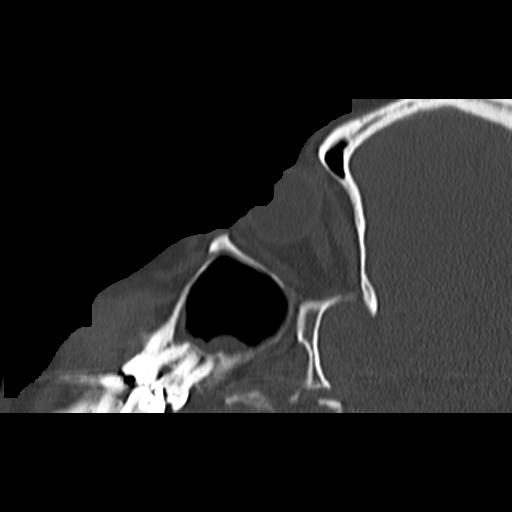

[16 of 37 positions shown; findings below may reference images not displayed]

FINDINGS: Mild bilateral maxillary sinus mucosal thickening is
noted without air-fluid level.  Ethmoid sinuses show mild mucosal
thickening, worse on the right.  Minimal fluid in the sphenoid
sinus.  No significant fluid in the frontal sinuses.  Slight fluid
accumulation is noted in the right nasal cavity lateral to the
middle turbinate.  Mucosal thickening inferolateral to the ethmoid
air cells narrows the infundibuli  bilaterally. There is 2 mm of
nasal septal deviation right-to-left.
IMPRESSION: Chronic sinusitis as described.

## 2015-06-22 DIAGNOSIS — H5213 Myopia, bilateral: Secondary | ICD-10-CM | POA: Diagnosis not present

## 2015-06-22 DIAGNOSIS — H524 Presbyopia: Secondary | ICD-10-CM | POA: Diagnosis not present

## 2015-06-22 DIAGNOSIS — H52223 Regular astigmatism, bilateral: Secondary | ICD-10-CM | POA: Diagnosis not present

## 2015-12-14 DIAGNOSIS — Z79899 Other long term (current) drug therapy: Secondary | ICD-10-CM | POA: Diagnosis not present

## 2015-12-14 DIAGNOSIS — Z6836 Body mass index (BMI) 36.0-36.9, adult: Secondary | ICD-10-CM | POA: Diagnosis not present

## 2015-12-14 DIAGNOSIS — E669 Obesity, unspecified: Secondary | ICD-10-CM | POA: Diagnosis not present

## 2015-12-14 DIAGNOSIS — Z0000 Encounter for general adult medical examination without abnormal findings: Secondary | ICD-10-CM | POA: Diagnosis not present

## 2015-12-14 DIAGNOSIS — Z13220 Encounter for screening for lipoid disorders: Secondary | ICD-10-CM | POA: Diagnosis not present

## 2016-06-26 DIAGNOSIS — H52223 Regular astigmatism, bilateral: Secondary | ICD-10-CM | POA: Diagnosis not present

## 2016-06-26 DIAGNOSIS — H5213 Myopia, bilateral: Secondary | ICD-10-CM | POA: Diagnosis not present

## 2016-06-26 DIAGNOSIS — H524 Presbyopia: Secondary | ICD-10-CM | POA: Diagnosis not present

## 2017-01-07 DIAGNOSIS — R030 Elevated blood-pressure reading, without diagnosis of hypertension: Secondary | ICD-10-CM | POA: Diagnosis not present

## 2017-01-07 DIAGNOSIS — Z79899 Other long term (current) drug therapy: Secondary | ICD-10-CM | POA: Diagnosis not present

## 2017-01-07 DIAGNOSIS — Z6837 Body mass index (BMI) 37.0-37.9, adult: Secondary | ICD-10-CM | POA: Diagnosis not present

## 2017-01-07 DIAGNOSIS — Z0000 Encounter for general adult medical examination without abnormal findings: Secondary | ICD-10-CM | POA: Diagnosis not present

## 2017-01-07 DIAGNOSIS — Z23 Encounter for immunization: Secondary | ICD-10-CM | POA: Diagnosis not present

## 2017-01-07 DIAGNOSIS — E669 Obesity, unspecified: Secondary | ICD-10-CM | POA: Diagnosis not present

## 2017-01-08 DIAGNOSIS — I10 Essential (primary) hypertension: Secondary | ICD-10-CM | POA: Diagnosis not present

## 2017-01-08 DIAGNOSIS — R030 Elevated blood-pressure reading, without diagnosis of hypertension: Secondary | ICD-10-CM | POA: Diagnosis not present

## 2017-01-08 DIAGNOSIS — Z79899 Other long term (current) drug therapy: Secondary | ICD-10-CM | POA: Diagnosis not present

## 2017-03-11 DIAGNOSIS — Z6835 Body mass index (BMI) 35.0-35.9, adult: Secondary | ICD-10-CM | POA: Diagnosis not present

## 2017-03-11 DIAGNOSIS — R030 Elevated blood-pressure reading, without diagnosis of hypertension: Secondary | ICD-10-CM | POA: Diagnosis not present

## 2017-05-20 DIAGNOSIS — I10 Essential (primary) hypertension: Secondary | ICD-10-CM | POA: Diagnosis not present

## 2017-06-27 MED FILL — HYDROCHLOROTHIAZIDE 12.5 MG: 12.5 | 30 days supply | Qty: 30 | Fill #0

## 2017-07-02 DIAGNOSIS — H524 Presbyopia: Secondary | ICD-10-CM | POA: Diagnosis not present

## 2017-07-02 DIAGNOSIS — H5213 Myopia, bilateral: Secondary | ICD-10-CM | POA: Diagnosis not present

## 2017-07-02 DIAGNOSIS — H52223 Regular astigmatism, bilateral: Secondary | ICD-10-CM | POA: Diagnosis not present

## 2017-07-03 DIAGNOSIS — Z6835 Body mass index (BMI) 35.0-35.9, adult: Secondary | ICD-10-CM | POA: Diagnosis not present

## 2017-07-03 DIAGNOSIS — E669 Obesity, unspecified: Secondary | ICD-10-CM | POA: Diagnosis not present

## 2017-07-03 DIAGNOSIS — Z79899 Other long term (current) drug therapy: Secondary | ICD-10-CM | POA: Diagnosis not present

## 2017-07-03 DIAGNOSIS — I10 Essential (primary) hypertension: Secondary | ICD-10-CM | POA: Diagnosis not present

## 2017-07-26 MED FILL — HYDROCHLOROTHIAZIDE 12.5 MG: 12.5 | 30 days supply | Qty: 30 | Fill #1

## 2017-08-28 MED FILL — HYDROCHLOROTHIAZIDE 12.5 MG: 12.5 | 30 days supply | Qty: 30 | Fill #2

## 2017-09-27 MED FILL — HYDROCHLOROTHIAZIDE 12.5 MG: 12.5 | 30 days supply | Qty: 30 | Fill #3

## 2017-10-25 MED FILL — HYDROCHLOROTHIAZIDE 12.5 MG: 12.5 | 30 days supply | Qty: 30 | Fill #4

## 2017-11-25 MED FILL — HYDROCHLOROTHIAZIDE 12.5 MG: 12.5 | 90 days supply | Qty: 90 | Fill #0

## 2018-01-08 DIAGNOSIS — E669 Obesity, unspecified: Secondary | ICD-10-CM | POA: Diagnosis not present

## 2018-01-08 DIAGNOSIS — Z79899 Other long term (current) drug therapy: Secondary | ICD-10-CM | POA: Diagnosis not present

## 2018-01-08 DIAGNOSIS — Z0000 Encounter for general adult medical examination without abnormal findings: Secondary | ICD-10-CM | POA: Diagnosis not present

## 2018-01-08 DIAGNOSIS — I10 Essential (primary) hypertension: Secondary | ICD-10-CM | POA: Diagnosis not present

## 2018-01-08 DIAGNOSIS — Z6837 Body mass index (BMI) 37.0-37.9, adult: Secondary | ICD-10-CM | POA: Diagnosis not present

## 2018-01-08 DIAGNOSIS — J309 Allergic rhinitis, unspecified: Secondary | ICD-10-CM | POA: Diagnosis not present

## 2018-01-08 DIAGNOSIS — Z23 Encounter for immunization: Secondary | ICD-10-CM | POA: Diagnosis not present

## 2018-02-25 MED FILL — HYDROCHLOROTHIAZIDE 12.5 MG: 12.5 | 90 days supply | Qty: 90 | Fill #1

## 2018-05-26 MED FILL — HYDROCHLOROTHIAZIDE 12.5 MG: 12.5 | 90 days supply | Qty: 90 | Fill #0

## 2018-07-06 DIAGNOSIS — R1084 Generalized abdominal pain: Secondary | ICD-10-CM | POA: Diagnosis not present

## 2018-07-06 DIAGNOSIS — R21 Rash and other nonspecific skin eruption: Secondary | ICD-10-CM | POA: Diagnosis not present

## 2018-07-06 DIAGNOSIS — R3989 Other symptoms and signs involving the genitourinary system: Secondary | ICD-10-CM | POA: Diagnosis not present

## 2018-07-06 DIAGNOSIS — R112 Nausea with vomiting, unspecified: Secondary | ICD-10-CM | POA: Diagnosis not present

## 2018-07-09 DIAGNOSIS — N3000 Acute cystitis without hematuria: Secondary | ICD-10-CM | POA: Diagnosis not present

## 2018-07-09 DIAGNOSIS — R1013 Epigastric pain: Secondary | ICD-10-CM | POA: Diagnosis not present

## 2018-07-09 DIAGNOSIS — I10 Essential (primary) hypertension: Secondary | ICD-10-CM | POA: Diagnosis not present

## 2018-07-09 DIAGNOSIS — E669 Obesity, unspecified: Secondary | ICD-10-CM | POA: Diagnosis not present

## 2018-11-11 DIAGNOSIS — H524 Presbyopia: Secondary | ICD-10-CM | POA: Diagnosis not present

## 2018-11-11 DIAGNOSIS — H5213 Myopia, bilateral: Secondary | ICD-10-CM | POA: Diagnosis not present

## 2018-11-11 DIAGNOSIS — H52223 Regular astigmatism, bilateral: Secondary | ICD-10-CM | POA: Diagnosis not present

## 2019-02-02 DIAGNOSIS — Z79899 Other long term (current) drug therapy: Secondary | ICD-10-CM | POA: Diagnosis not present

## 2019-02-02 DIAGNOSIS — Z6837 Body mass index (BMI) 37.0-37.9, adult: Secondary | ICD-10-CM | POA: Diagnosis not present

## 2019-02-02 DIAGNOSIS — I10 Essential (primary) hypertension: Secondary | ICD-10-CM | POA: Diagnosis not present

## 2019-02-02 DIAGNOSIS — L989 Disorder of the skin and subcutaneous tissue, unspecified: Secondary | ICD-10-CM | POA: Diagnosis not present

## 2019-02-02 DIAGNOSIS — Z23 Encounter for immunization: Secondary | ICD-10-CM | POA: Diagnosis not present

## 2019-02-02 DIAGNOSIS — Z0000 Encounter for general adult medical examination without abnormal findings: Secondary | ICD-10-CM | POA: Diagnosis not present

## 2019-07-25 ENCOUNTER — Ambulatory Visit: Payer: 59 | Attending: Internal Medicine

## 2019-07-25 DIAGNOSIS — Z23 Encounter for immunization: Secondary | ICD-10-CM

## 2019-07-25 NOTE — Progress Notes (Signed)
   Covid-19 Vaccination Clinic  Name:  Brandy Payne    MRN: [id number] DOB: 1960-05-07  07/25/2019  Brandy Payne was observed post Covid-19 immunization for 15 minutes without incident. She was provided with Vaccine Information Sheet and instruction to access the V-Safe system.   Brandy Payne was instructed to call 911 with any severe reactions post vaccine: Marland Kitchen Difficulty breathing  . Swelling of face and throat  . A fast heartbeat  . A bad rash all over body  . Dizziness and weakness   Immunizations Administered    Name Date Dose VIS Date Route   Pfizer COVID-19 Vaccine 07/25/2019 12:09 PM 0.3 mL 04/03/2019 Intramuscular   Manufacturer: [employer], [employer]   Lot: [id number]   NDC: [address]-1

## 2019-08-19 ENCOUNTER — Ambulatory Visit: Payer: 59 | Attending: Internal Medicine

## 2019-08-19 DIAGNOSIS — Z23 Encounter for immunization: Secondary | ICD-10-CM

## 2019-08-19 NOTE — Progress Notes (Signed)
   Covid-19 Vaccination Clinic  Name:  DORE OQUIN    MRN: [id number] DOB: 01/16/1961  08/19/2019  Ms. Belmonte was observed post Covid-19 immunization for 15 minutes without incident. She was provided with Vaccine Information Sheet and instruction to access the V-Safe system.   Ms. Bribiesca was instructed to call 911 with any severe reactions post vaccine: Marland Kitchen Difficulty breathing  . Swelling of face and throat  . A fast heartbeat  . A bad rash all over body  . Dizziness and weakness   Immunizations Administered    Name Date Dose VIS Date Route   Pfizer COVID-19 Vaccine 08/19/2019  8:49 AM 0.3 mL 06/17/2018 Intramuscular   Manufacturer: [employer], [employer]   Lot: [id number]   NDC: [address]-9

## 2019-09-30 ENCOUNTER — Other Ambulatory Visit (HOSPITAL_COMMUNITY): Payer: Self-pay | Admitting: Geriatric Medicine

## 2020-01-04 MED FILL — HYDROCHLOROTHIAZIDE 12.5 MG: 12.5 | 90 days supply | Qty: 90 | Fill #1

## 2020-01-15 DIAGNOSIS — H5213 Myopia, bilateral: Secondary | ICD-10-CM | POA: Diagnosis not present

## 2020-01-15 DIAGNOSIS — H52223 Regular astigmatism, bilateral: Secondary | ICD-10-CM | POA: Diagnosis not present

## 2020-01-15 DIAGNOSIS — H524 Presbyopia: Secondary | ICD-10-CM | POA: Diagnosis not present

## 2020-02-23 DIAGNOSIS — Z6838 Body mass index (BMI) 38.0-38.9, adult: Secondary | ICD-10-CM | POA: Diagnosis not present

## 2020-02-23 DIAGNOSIS — I10 Essential (primary) hypertension: Secondary | ICD-10-CM | POA: Diagnosis not present

## 2020-02-23 DIAGNOSIS — Z79899 Other long term (current) drug therapy: Secondary | ICD-10-CM | POA: Diagnosis not present

## 2020-02-23 DIAGNOSIS — Z23 Encounter for immunization: Secondary | ICD-10-CM | POA: Diagnosis not present

## 2020-02-23 DIAGNOSIS — Z0000 Encounter for general adult medical examination without abnormal findings: Secondary | ICD-10-CM | POA: Diagnosis not present

## 2020-03-31 DIAGNOSIS — U071 COVID-19: Secondary | ICD-10-CM | POA: Diagnosis not present

## 2020-04-01 ENCOUNTER — Telehealth: Payer: Self-pay | Admitting: Physician Assistant

## 2020-04-01 ENCOUNTER — Encounter: Payer: Self-pay | Admitting: Physician Assistant

## 2020-04-01 NOTE — Telephone Encounter (Signed)
Called to discuss with patient about Covid symptoms and the use of sotrovimab, bamlanivimab/etesevimab or casirivimab/imdevimab, a monoclonal antibody infusion for those with mild to moderate Covid symptoms and at a high risk of hospitalization.  Pt is qualified for this infusion at the [facility] due to; Specific high risk criteria : BMI > 25   Message left to call back our hotline [phone]. My chart message sent if active on Mychart.   Zyasia Halbleib PA-C  

## 2020-04-07 MED FILL — HYDROCHLOROTHIAZIDE 12.5 MG: 12.5 | 90 days supply | Qty: 90 | Fill #2

## 2020-07-08 DIAGNOSIS — H40053 Ocular hypertension, bilateral: Secondary | ICD-10-CM | POA: Diagnosis not present

## 2020-07-08 DIAGNOSIS — H04123 Dry eye syndrome of bilateral lacrimal glands: Secondary | ICD-10-CM | POA: Diagnosis not present

## 2020-07-08 DIAGNOSIS — H40013 Open angle with borderline findings, low risk, bilateral: Secondary | ICD-10-CM | POA: Diagnosis not present

## 2020-07-08 DIAGNOSIS — H353111 Nonexudative age-related macular degeneration, right eye, early dry stage: Secondary | ICD-10-CM | POA: Diagnosis not present

## 2020-07-12 DIAGNOSIS — M9904 Segmental and somatic dysfunction of sacral region: Secondary | ICD-10-CM | POA: Diagnosis not present

## 2020-07-12 DIAGNOSIS — M9903 Segmental and somatic dysfunction of lumbar region: Secondary | ICD-10-CM | POA: Diagnosis not present

## 2020-07-12 DIAGNOSIS — M545 Low back pain, unspecified: Secondary | ICD-10-CM | POA: Diagnosis not present

## 2020-07-12 DIAGNOSIS — M9905 Segmental and somatic dysfunction of pelvic region: Secondary | ICD-10-CM | POA: Diagnosis not present

## 2020-07-18 DIAGNOSIS — M9905 Segmental and somatic dysfunction of pelvic region: Secondary | ICD-10-CM | POA: Diagnosis not present

## 2020-07-18 DIAGNOSIS — M9903 Segmental and somatic dysfunction of lumbar region: Secondary | ICD-10-CM | POA: Diagnosis not present

## 2020-07-18 DIAGNOSIS — M545 Low back pain, unspecified: Secondary | ICD-10-CM | POA: Diagnosis not present

## 2020-07-18 DIAGNOSIS — M9904 Segmental and somatic dysfunction of sacral region: Secondary | ICD-10-CM | POA: Diagnosis not present

## 2020-07-21 DIAGNOSIS — M9904 Segmental and somatic dysfunction of sacral region: Secondary | ICD-10-CM | POA: Diagnosis not present

## 2020-07-21 DIAGNOSIS — M9903 Segmental and somatic dysfunction of lumbar region: Secondary | ICD-10-CM | POA: Diagnosis not present

## 2020-07-21 DIAGNOSIS — M9905 Segmental and somatic dysfunction of pelvic region: Secondary | ICD-10-CM | POA: Diagnosis not present

## 2020-07-21 DIAGNOSIS — M545 Low back pain, unspecified: Secondary | ICD-10-CM | POA: Diagnosis not present

## 2020-08-29 DIAGNOSIS — Z6838 Body mass index (BMI) 38.0-38.9, adult: Secondary | ICD-10-CM | POA: Diagnosis not present

## 2020-08-29 DIAGNOSIS — I10 Essential (primary) hypertension: Secondary | ICD-10-CM | POA: Diagnosis not present

## 2020-10-04 ENCOUNTER — Other Ambulatory Visit (HOSPITAL_COMMUNITY): Payer: Self-pay

## 2020-10-05 ENCOUNTER — Other Ambulatory Visit (HOSPITAL_COMMUNITY): Payer: Self-pay

## 2020-10-07 ENCOUNTER — Other Ambulatory Visit (HOSPITAL_COMMUNITY): Payer: Self-pay

## 2020-10-11 ENCOUNTER — Other Ambulatory Visit (HOSPITAL_COMMUNITY): Payer: Self-pay

## 2020-10-13 ENCOUNTER — Other Ambulatory Visit (HOSPITAL_COMMUNITY): Payer: Self-pay

## 2020-10-18 ENCOUNTER — Other Ambulatory Visit (HOSPITAL_COMMUNITY): Payer: Self-pay

## 2020-10-18 MED ORDER — HYDROCHLOROTHIAZIDE 12.5 MG PO CAPS
ORAL_CAPSULE | ORAL | 4 refills | Status: AC
  Filled 2020-10-18: qty 90, 90d supply, fill #0
  Filled 2021-01-16: qty 90, 90d supply, fill #1
  Filled 2021-04-11: qty 90, 90d supply, fill #2
  Filled 2021-07-14: qty 90, 90d supply, fill #3
  Filled 2021-10-12: qty 90, 90d supply, fill #4

## 2020-10-19 ENCOUNTER — Other Ambulatory Visit (HOSPITAL_COMMUNITY): Payer: Self-pay

## 2020-10-26 ENCOUNTER — Other Ambulatory Visit (HOSPITAL_COMMUNITY): Payer: Self-pay

## 2021-01-13 DIAGNOSIS — H40013 Open angle with borderline findings, low risk, bilateral: Secondary | ICD-10-CM | POA: Diagnosis not present

## 2021-01-13 DIAGNOSIS — H40053 Ocular hypertension, bilateral: Secondary | ICD-10-CM | POA: Diagnosis not present

## 2021-01-13 DIAGNOSIS — H04123 Dry eye syndrome of bilateral lacrimal glands: Secondary | ICD-10-CM | POA: Diagnosis not present

## 2021-01-13 DIAGNOSIS — H353131 Nonexudative age-related macular degeneration, bilateral, early dry stage: Secondary | ICD-10-CM | POA: Diagnosis not present

## 2021-01-16 ENCOUNTER — Other Ambulatory Visit (HOSPITAL_COMMUNITY): Payer: Self-pay

## 2021-02-27 DIAGNOSIS — Z23 Encounter for immunization: Secondary | ICD-10-CM | POA: Diagnosis not present

## 2021-02-27 DIAGNOSIS — Z0000 Encounter for general adult medical examination without abnormal findings: Secondary | ICD-10-CM | POA: Diagnosis not present

## 2021-02-27 DIAGNOSIS — Z6838 Body mass index (BMI) 38.0-38.9, adult: Secondary | ICD-10-CM | POA: Diagnosis not present

## 2021-02-27 DIAGNOSIS — Z79899 Other long term (current) drug therapy: Secondary | ICD-10-CM | POA: Diagnosis not present

## 2021-02-27 DIAGNOSIS — I10 Essential (primary) hypertension: Secondary | ICD-10-CM | POA: Diagnosis not present

## 2021-03-30 DIAGNOSIS — J019 Acute sinusitis, unspecified: Secondary | ICD-10-CM | POA: Diagnosis not present

## 2021-03-30 DIAGNOSIS — B9689 Other specified bacterial agents as the cause of diseases classified elsewhere: Secondary | ICD-10-CM | POA: Diagnosis not present

## 2021-03-30 DIAGNOSIS — R051 Acute cough: Secondary | ICD-10-CM | POA: Diagnosis not present

## 2021-04-11 ENCOUNTER — Other Ambulatory Visit (HOSPITAL_COMMUNITY): Payer: Self-pay

## 2021-07-14 ENCOUNTER — Other Ambulatory Visit (HOSPITAL_COMMUNITY): Payer: Self-pay

## 2021-08-08 DIAGNOSIS — H40013 Open angle with borderline findings, low risk, bilateral: Secondary | ICD-10-CM | POA: Diagnosis not present

## 2021-08-08 DIAGNOSIS — H40053 Ocular hypertension, bilateral: Secondary | ICD-10-CM | POA: Diagnosis not present

## 2021-08-29 DIAGNOSIS — Z6838 Body mass index (BMI) 38.0-38.9, adult: Secondary | ICD-10-CM | POA: Diagnosis not present

## 2021-08-29 DIAGNOSIS — I10 Essential (primary) hypertension: Secondary | ICD-10-CM | POA: Diagnosis not present

## 2021-08-29 DIAGNOSIS — Z79899 Other long term (current) drug therapy: Secondary | ICD-10-CM | POA: Diagnosis not present

## 2021-10-12 ENCOUNTER — Other Ambulatory Visit (HOSPITAL_COMMUNITY): Payer: Self-pay

## 2022-01-08 ENCOUNTER — Other Ambulatory Visit (HOSPITAL_COMMUNITY): Payer: Self-pay

## 2022-01-08 MED ORDER — HYDROCHLOROTHIAZIDE 12.5 MG PO CAPS
12.5000 mg | ORAL_CAPSULE | ORAL | 3 refills | Status: AC
  Filled 2022-01-08: qty 90, 90d supply, fill #0
  Filled 2022-04-06: qty 90, 90d supply, fill #1
  Filled 2022-07-11: qty 90, 90d supply, fill #2
  Filled 2022-10-08: qty 90, 90d supply, fill #3

## 2022-01-09 ENCOUNTER — Other Ambulatory Visit (HOSPITAL_COMMUNITY): Payer: Self-pay

## 2022-02-13 DIAGNOSIS — H353131 Nonexudative age-related macular degeneration, bilateral, early dry stage: Secondary | ICD-10-CM | POA: Diagnosis not present

## 2022-02-13 DIAGNOSIS — H40013 Open angle with borderline findings, low risk, bilateral: Secondary | ICD-10-CM | POA: Diagnosis not present

## 2022-02-13 DIAGNOSIS — H52222 Regular astigmatism, left eye: Secondary | ICD-10-CM | POA: Diagnosis not present

## 2022-02-13 DIAGNOSIS — H5213 Myopia, bilateral: Secondary | ICD-10-CM | POA: Diagnosis not present

## 2022-02-13 DIAGNOSIS — H40053 Ocular hypertension, bilateral: Secondary | ICD-10-CM | POA: Diagnosis not present

## 2022-02-13 DIAGNOSIS — H524 Presbyopia: Secondary | ICD-10-CM | POA: Diagnosis not present

## 2022-02-13 DIAGNOSIS — H04123 Dry eye syndrome of bilateral lacrimal glands: Secondary | ICD-10-CM | POA: Diagnosis not present

## 2022-03-29 DIAGNOSIS — Z1211 Encounter for screening for malignant neoplasm of colon: Secondary | ICD-10-CM | POA: Diagnosis not present

## 2022-03-29 DIAGNOSIS — E7800 Pure hypercholesterolemia, unspecified: Secondary | ICD-10-CM | POA: Diagnosis not present

## 2022-03-29 DIAGNOSIS — Z79899 Other long term (current) drug therapy: Secondary | ICD-10-CM | POA: Diagnosis not present

## 2022-03-29 DIAGNOSIS — Z0000 Encounter for general adult medical examination without abnormal findings: Secondary | ICD-10-CM | POA: Diagnosis not present

## 2022-03-29 DIAGNOSIS — Z6838 Body mass index (BMI) 38.0-38.9, adult: Secondary | ICD-10-CM | POA: Diagnosis not present

## 2022-03-29 DIAGNOSIS — Z23 Encounter for immunization: Secondary | ICD-10-CM | POA: Diagnosis not present

## 2022-03-29 DIAGNOSIS — R739 Hyperglycemia, unspecified: Secondary | ICD-10-CM | POA: Diagnosis not present

## 2022-03-29 DIAGNOSIS — I10 Essential (primary) hypertension: Secondary | ICD-10-CM | POA: Diagnosis not present

## 2022-03-30 ENCOUNTER — Other Ambulatory Visit (HOSPITAL_COMMUNITY): Payer: Self-pay

## 2022-07-11 ENCOUNTER — Other Ambulatory Visit (HOSPITAL_COMMUNITY): Payer: Self-pay

## 2022-12-03 DIAGNOSIS — K5730 Diverticulosis of large intestine without perforation or abscess without bleeding: Secondary | ICD-10-CM | POA: Diagnosis not present

## 2022-12-03 DIAGNOSIS — K635 Polyp of colon: Secondary | ICD-10-CM | POA: Diagnosis not present

## 2022-12-03 DIAGNOSIS — D120 Benign neoplasm of cecum: Secondary | ICD-10-CM | POA: Diagnosis not present

## 2022-12-03 DIAGNOSIS — Z1211 Encounter for screening for malignant neoplasm of colon: Secondary | ICD-10-CM | POA: Diagnosis not present

## 2023-01-07 ENCOUNTER — Other Ambulatory Visit (HOSPITAL_COMMUNITY): Payer: Self-pay

## 2023-01-07 MED ORDER — HYDROCHLOROTHIAZIDE 12.5 MG PO CAPS
12.5000 mg | ORAL_CAPSULE | Freq: Every morning | ORAL | 0 refills | Status: AC
  Filled 2023-01-07: qty 90, 90d supply, fill #0

## 2023-02-06 ENCOUNTER — Other Ambulatory Visit (HOSPITAL_COMMUNITY): Payer: Self-pay

## 2023-03-08 DIAGNOSIS — H04123 Dry eye syndrome of bilateral lacrimal glands: Secondary | ICD-10-CM | POA: Diagnosis not present

## 2023-03-08 DIAGNOSIS — H40053 Ocular hypertension, bilateral: Secondary | ICD-10-CM | POA: Diagnosis not present

## 2023-03-08 DIAGNOSIS — H524 Presbyopia: Secondary | ICD-10-CM | POA: Diagnosis not present

## 2023-03-08 DIAGNOSIS — H5213 Myopia, bilateral: Secondary | ICD-10-CM | POA: Diagnosis not present

## 2023-03-08 DIAGNOSIS — H52222 Regular astigmatism, left eye: Secondary | ICD-10-CM | POA: Diagnosis not present

## 2023-03-08 DIAGNOSIS — H40013 Open angle with borderline findings, low risk, bilateral: Secondary | ICD-10-CM | POA: Diagnosis not present

## 2023-04-09 ENCOUNTER — Other Ambulatory Visit: Payer: Self-pay | Admitting: Internal Medicine

## 2023-04-09 DIAGNOSIS — Z79899 Other long term (current) drug therapy: Secondary | ICD-10-CM | POA: Diagnosis not present

## 2023-04-09 DIAGNOSIS — Z6839 Body mass index (BMI) 39.0-39.9, adult: Secondary | ICD-10-CM | POA: Diagnosis not present

## 2023-04-09 DIAGNOSIS — Z1231 Encounter for screening mammogram for malignant neoplasm of breast: Secondary | ICD-10-CM

## 2023-04-09 DIAGNOSIS — Z1211 Encounter for screening for malignant neoplasm of colon: Secondary | ICD-10-CM | POA: Diagnosis not present

## 2023-04-09 DIAGNOSIS — E7800 Pure hypercholesterolemia, unspecified: Secondary | ICD-10-CM | POA: Diagnosis not present

## 2023-04-09 DIAGNOSIS — R7303 Prediabetes: Secondary | ICD-10-CM | POA: Diagnosis not present

## 2023-04-09 DIAGNOSIS — I10 Essential (primary) hypertension: Secondary | ICD-10-CM | POA: Diagnosis not present

## 2023-04-09 DIAGNOSIS — Z23 Encounter for immunization: Secondary | ICD-10-CM | POA: Diagnosis not present

## 2023-04-09 DIAGNOSIS — Z0000 Encounter for general adult medical examination without abnormal findings: Secondary | ICD-10-CM | POA: Diagnosis not present

## 2023-04-10 ENCOUNTER — Other Ambulatory Visit (HOSPITAL_COMMUNITY): Payer: Self-pay

## 2023-04-10 MED ORDER — METFORMIN HCL ER 500 MG PO TB24
500.0000 mg | ORAL_TABLET | Freq: Every day | ORAL | 3 refills | Status: AC
  Filled 2023-04-10: qty 90, 90d supply, fill #0

## 2023-04-11 ENCOUNTER — Other Ambulatory Visit (HOSPITAL_COMMUNITY): Payer: Self-pay

## 2023-04-12 ENCOUNTER — Other Ambulatory Visit (HOSPITAL_COMMUNITY): Payer: Self-pay

## 2023-05-07 ENCOUNTER — Ambulatory Visit
Admission: RE | Admit: 2023-05-07 | Discharge: 2023-05-07 | Disposition: A | Discharge status: Home / Self Care | Payer: Self-pay | Source: Ambulatory Visit | Attending: Internal Medicine | Admitting: Internal Medicine

## 2023-05-07 DIAGNOSIS — Z1231 Encounter for screening mammogram for malignant neoplasm of breast: Secondary | ICD-10-CM | POA: Diagnosis not present

## 2023-05-09 ENCOUNTER — Other Ambulatory Visit: Payer: Self-pay | Admitting: Internal Medicine

## 2023-05-09 DIAGNOSIS — R928 Other abnormal and inconclusive findings on diagnostic imaging of breast: Secondary | ICD-10-CM

## 2023-05-18 ENCOUNTER — Ambulatory Visit
Admission: RE | Admit: 2023-05-18 | Discharge: 2023-05-18 | Disposition: A | Discharge status: Home / Self Care | Payer: Commercial Managed Care - PPO | Source: Ambulatory Visit | Attending: Internal Medicine | Admitting: Internal Medicine

## 2023-05-18 DIAGNOSIS — R928 Other abnormal and inconclusive findings on diagnostic imaging of breast: Secondary | ICD-10-CM

## 2023-05-18 DIAGNOSIS — N6011 Diffuse cystic mastopathy of right breast: Secondary | ICD-10-CM | POA: Diagnosis not present

## 2023-05-27 ENCOUNTER — Other Ambulatory Visit: Payer: Commercial Managed Care - PPO

## 2023-07-10 ENCOUNTER — Other Ambulatory Visit: Payer: Self-pay

## 2023-07-10 ENCOUNTER — Other Ambulatory Visit (HOSPITAL_COMMUNITY): Payer: Self-pay

## 2023-07-10 NOTE — Progress Notes (Signed)
 Received phone call from [facility] in regards to [facility] eye drops stating that they are receiving rejection that we have to fill internally. Confirmed with Duncan Dull this is limited distribution. [facility] plans to fax to [place] regardless and rx can be profiled as Soledad Gerlach is looking into override and next in line pharmacy for fill.

## 2023-07-10 NOTE — Progress Notes (Signed)
 Per Soledad Gerlach, override is in place for [facility] to fill at either location below but there is not an active prior authorization on file which [facility] will need to initiate:   > secondary [facility] #[id number] P: [phone] F: [phone] Pharmacy NPI# [id number] Pharmacy NABP# [id number] Pharmacy Address: [address], [place], [place] [address]   or   > secondary [facility] #[id number] P: [phone] F: [phone] Pharmacy NPI# [id number] Pharmacy NABP# [id number] Pharmacy Address: [address], [place], [place] [address]   Spoke to [facility] representative Marcelle Smiling C. to advise. Spoke with patient as well and notified of same. Provided her with contact information for pharmacy in case she needs to reach them as well as contact information to enroll in manufacturer savings program if needed.

## 2023-08-02 ENCOUNTER — Other Ambulatory Visit (HOSPITAL_COMMUNITY): Payer: Self-pay

## 2023-09-05 ENCOUNTER — Other Ambulatory Visit (HOSPITAL_COMMUNITY): Payer: Self-pay

## 2023-09-13 ENCOUNTER — Other Ambulatory Visit (HOSPITAL_COMMUNITY): Payer: Self-pay

## 2023-09-13 MED ORDER — HYDROCHLOROTHIAZIDE 12.5 MG PO CAPS
12.5000 mg | ORAL_CAPSULE | ORAL | 1 refills | Status: AC
  Filled 2023-09-13 – 2024-04-24 (×3): qty 90, 90d supply, fill #0

## 2023-09-25 ENCOUNTER — Other Ambulatory Visit (HOSPITAL_COMMUNITY): Payer: Self-pay

## 2023-11-20 DIAGNOSIS — H6991 Unspecified Eustachian tube disorder, right ear: Secondary | ICD-10-CM | POA: Diagnosis not present

## 2023-11-20 DIAGNOSIS — I10 Essential (primary) hypertension: Secondary | ICD-10-CM | POA: Diagnosis not present

## 2023-11-20 DIAGNOSIS — H9201 Otalgia, right ear: Secondary | ICD-10-CM | POA: Diagnosis not present

## 2024-02-10 ENCOUNTER — Other Ambulatory Visit (HOSPITAL_COMMUNITY): Payer: Self-pay

## 2024-02-10 MED ORDER — HYDROCHLOROTHIAZIDE 12.5 MG PO CAPS
12.5000 mg | ORAL_CAPSULE | Freq: Every morning | ORAL | 3 refills | Status: AC
  Filled 2024-02-10 – 2024-04-22 (×3): qty 90, 90d supply, fill #0

## 2024-03-10 DIAGNOSIS — H04123 Dry eye syndrome of bilateral lacrimal glands: Secondary | ICD-10-CM | POA: Diagnosis not present

## 2024-03-10 DIAGNOSIS — H524 Presbyopia: Secondary | ICD-10-CM | POA: Diagnosis not present

## 2024-03-10 DIAGNOSIS — H5213 Myopia, bilateral: Secondary | ICD-10-CM | POA: Diagnosis not present

## 2024-03-10 DIAGNOSIS — H52222 Regular astigmatism, left eye: Secondary | ICD-10-CM | POA: Diagnosis not present

## 2024-03-10 DIAGNOSIS — H40053 Ocular hypertension, bilateral: Secondary | ICD-10-CM | POA: Diagnosis not present

## 2024-03-10 DIAGNOSIS — H353131 Nonexudative age-related macular degeneration, bilateral, early dry stage: Secondary | ICD-10-CM | POA: Diagnosis not present

## 2024-03-10 DIAGNOSIS — H40013 Open angle with borderline findings, low risk, bilateral: Secondary | ICD-10-CM | POA: Diagnosis not present

## 2024-04-10 ENCOUNTER — Other Ambulatory Visit: Payer: Self-pay | Admitting: Internal Medicine

## 2024-04-10 DIAGNOSIS — Z1231 Encounter for screening mammogram for malignant neoplasm of breast: Secondary | ICD-10-CM

## 2024-04-15 DIAGNOSIS — Z1231 Encounter for screening mammogram for malignant neoplasm of breast: Secondary | ICD-10-CM | POA: Diagnosis not present

## 2024-04-15 DIAGNOSIS — Z6841 Body Mass Index (BMI) 40.0 and over, adult: Secondary | ICD-10-CM | POA: Diagnosis not present

## 2024-04-15 DIAGNOSIS — R7303 Prediabetes: Secondary | ICD-10-CM | POA: Diagnosis not present

## 2024-04-15 DIAGNOSIS — E7800 Pure hypercholesterolemia, unspecified: Secondary | ICD-10-CM | POA: Diagnosis not present

## 2024-04-15 DIAGNOSIS — Z23 Encounter for immunization: Secondary | ICD-10-CM | POA: Diagnosis not present

## 2024-04-15 DIAGNOSIS — Z79899 Other long term (current) drug therapy: Secondary | ICD-10-CM | POA: Diagnosis not present

## 2024-04-15 DIAGNOSIS — Z0000 Encounter for general adult medical examination without abnormal findings: Secondary | ICD-10-CM | POA: Diagnosis not present

## 2024-04-15 DIAGNOSIS — Z1211 Encounter for screening for malignant neoplasm of colon: Secondary | ICD-10-CM | POA: Diagnosis not present

## 2024-04-15 DIAGNOSIS — I10 Essential (primary) hypertension: Secondary | ICD-10-CM | POA: Diagnosis not present

## 2024-04-22 ENCOUNTER — Other Ambulatory Visit (HOSPITAL_COMMUNITY): Payer: Self-pay

## 2024-04-24 ENCOUNTER — Other Ambulatory Visit: Payer: Self-pay

## 2024-04-24 ENCOUNTER — Other Ambulatory Visit (HOSPITAL_COMMUNITY): Payer: Self-pay

## 2024-05-07 ENCOUNTER — Ambulatory Visit
Admission: RE | Admit: 2024-05-07 | Discharge: 2024-05-07 | Disposition: A | Source: Ambulatory Visit | Attending: Internal Medicine | Admitting: Internal Medicine

## 2024-05-07 DIAGNOSIS — Z1231 Encounter for screening mammogram for malignant neoplasm of breast: Secondary | ICD-10-CM
